# Patient Record
Sex: Female | Born: 1980
Health system: Southern US, Community
[De-identification: ages and names within clinical notes are randomized; demographics above are authoritative.]

## PROBLEM LIST (undated history)

## (undated) DIAGNOSIS — M549 Dorsalgia, unspecified: Secondary | ICD-10-CM

## (undated) DIAGNOSIS — F419 Anxiety disorder, unspecified: Secondary | ICD-10-CM

## (undated) DIAGNOSIS — I10 Essential (primary) hypertension: Secondary | ICD-10-CM

## (undated) DIAGNOSIS — R519 Headache, unspecified: Secondary | ICD-10-CM

## (undated) DIAGNOSIS — E7800 Pure hypercholesterolemia, unspecified: Secondary | ICD-10-CM

## (undated) DIAGNOSIS — R51 Headache: Secondary | ICD-10-CM

## (undated) DIAGNOSIS — K5900 Constipation, unspecified: Secondary | ICD-10-CM

## (undated) DIAGNOSIS — K219 Gastro-esophageal reflux disease without esophagitis: Secondary | ICD-10-CM

## (undated) DIAGNOSIS — Z803 Family history of malignant neoplasm of breast: Secondary | ICD-10-CM

## (undated) DIAGNOSIS — R7303 Prediabetes: Secondary | ICD-10-CM

## (undated) HISTORY — DX: Prediabetes: R73.03

## (undated) HISTORY — PX: BREAST SURGERY: SHX581

## (undated) HISTORY — DX: Constipation, unspecified: K59.00

## (undated) HISTORY — DX: Dorsalgia, unspecified: M54.9

## (undated) HISTORY — DX: Essential (primary) hypertension: I10

## (undated) HISTORY — DX: Anxiety disorder, unspecified: F41.9

## (undated) HISTORY — PX: TONSILLECTOMY: SUR1361

## (undated) HISTORY — DX: Family history of malignant neoplasm of breast: Z80.3

---

## 2013-02-19 ENCOUNTER — Encounter: Payer: Self-pay | Admitting: Family Medicine

## 2013-02-19 ENCOUNTER — Ambulatory Visit (INDEPENDENT_AMBULATORY_CARE_PROVIDER_SITE_OTHER): Payer: 59 | Admitting: Family Medicine

## 2013-02-19 VITALS — BP 108/80 | HR 76 | Temp 98.2°F | Wt 174.0 lb

## 2013-02-19 DIAGNOSIS — F411 Generalized anxiety disorder: Secondary | ICD-10-CM

## 2013-02-19 DIAGNOSIS — Z8249 Family history of ischemic heart disease and other diseases of the circulatory system: Secondary | ICD-10-CM | POA: Insufficient documentation

## 2013-02-19 DIAGNOSIS — F329 Major depressive disorder, single episode, unspecified: Secondary | ICD-10-CM | POA: Insufficient documentation

## 2013-02-19 DIAGNOSIS — R002 Palpitations: Secondary | ICD-10-CM | POA: Insufficient documentation

## 2013-02-19 DIAGNOSIS — Z136 Encounter for screening for cardiovascular disorders: Secondary | ICD-10-CM

## 2013-02-19 DIAGNOSIS — M549 Dorsalgia, unspecified: Secondary | ICD-10-CM | POA: Insufficient documentation

## 2013-02-19 DIAGNOSIS — F32A Depression, unspecified: Secondary | ICD-10-CM | POA: Insufficient documentation

## 2013-02-19 DIAGNOSIS — F419 Anxiety disorder, unspecified: Secondary | ICD-10-CM | POA: Insufficient documentation

## 2013-02-19 LAB — LIPID PANEL
LDL Cholesterol: 122 mg/dL — ABNORMAL HIGH (ref 0–99)
VLDL: 18.2 mg/dL (ref 0.0–40.0)

## 2013-02-19 MED ORDER — FLUOXETINE HCL 20 MG PO CAPS: 20.0000 mg | ORAL_CAPSULE | Freq: Every day | ORAL | Status: DC

## 2013-02-19 NOTE — Progress Notes (Signed)
  Subjective:    Patient ID: Felicia Mcdowell, female    DOB: [birth year], 32 y.o.   MRN: [id number]  HPI 32 yo G3P2 here to establish care.  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  Anxiety- has been well controlled on Prozac 20 mg daily.  Unfortunately had a miscarriage last January and had to increase dose to 20 mg daily understandably.  Has been fine since.  Denies any symptoms of anxiety or depression.  Back pain- reports it as muscular from neck to lower back.  Tries to get massage very other month.  No radiculopathy.  Palpitations- does drink up to 6 cups of coffee per day.  In afternoon, sometimes heart races into 140s.  No CP, SOB or dizziness when this occurs.  Does not feel anxious when it is occuring.  Patient Active Problem List  Diagnosis  . Anxiety  . Back pain  . Family history of early CAD  . Palpitations   Past Medical History  Diagnosis Date  . Anxiety    No past surgical history on file. History  Substance Use Topics  . Smoking status: Not on file  . Smokeless tobacco: Not on file  . Alcohol Use: Not on file   Family History  Problem Relation Age of Onset  . Cancer Mother 49    breast CA, now with metastatic cancer  . Depression Mother   . Heart disease Father   . Hyperlipidemia Father    Allergies  Allergen Reactions  . Augmentin (Amoxicillin-Pot Clavulanate) Hives and Rash    Rash    No current outpatient prescriptions on file prior to visit.   No current facility-administered medications on file prior to visit.   The PMH, PSH, Social History, Family History, Medications, and allergies have been reviewed in [facility], and have been updated if relevant.    Review of Systems See HPI    Objective:   Physical Exam BP 108/80  Pulse 76  Temp(Src) 98.2 F (36.8 C)  Wt 174 lb (78.926 kg)  General:  Well-developed,well-nourished,in no acute distress; alert,appropriate and cooperative throughout examination Head:  normocephalic and atraumatic.    Lungs:  Normal respiratory effort, chest expands symmetrically. Lungs are clear to auscultation, no crackles or wheezes. Heart:  Normal rate and regular rhythm. S1 split, murmur, click, rub or other extra sounds. Abdomen:  Bowel sounds positive,abdomen soft and non-tender without masses, organomegaly or hernias noted. Msk:  No deformity or scoliosis noted of thoracic or lumbar spine.   Extremities:  No clubbing, cyanosis, edema, or deformity noted with normal full range of motion of all joints.   Neurologic:  alert & oriented X3 and gait normal.   Skin:  Intact without suspicious lesions or rashes Psych:  Cognition and judgment appear intact. Alert and cooperative with normal attention span and concentration. No apparent delusions, illusions, hallucinations     Assessment & Plan:  1. Family history of early CAD Will check lipid panel today. - Lipid Panel  2. Screening for ischemic heart disease See above. - Lipid Panel  3. Back pain Discussed breast reduction- she does wear a DD.  She will talk to husband and get back to me.  4. Anxiety Controlled on prozac and very occasional ativan.  Rx refilled.  5. Palpitations Discussed work up with pt, including EKG and Holter monitor.  Pulse is great today.  She would like to cut back on caffeine first and get back to me which is reasonable.

## 2013-02-19 NOTE — Patient Instructions (Addendum)
It was nice to meet you. Think about a breast reduction and let me know.  Keep in touch!  [phone]- call me on my cell phone anytime.

## 2013-08-13 ENCOUNTER — Other Ambulatory Visit: Payer: Self-pay | Admitting: Obstetrics and Gynecology

## 2013-08-13 DIAGNOSIS — Z1231 Encounter for screening mammogram for malignant neoplasm of breast: Secondary | ICD-10-CM

## 2013-09-11 ENCOUNTER — Ambulatory Visit
Admission: RE | Admit: 2013-09-11 | Discharge: 2013-09-11 | Disposition: A | Discharge status: Home / Self Care | Payer: 59 | Source: Ambulatory Visit | Attending: Obstetrics and Gynecology | Admitting: Obstetrics and Gynecology

## 2013-09-11 DIAGNOSIS — Z1231 Encounter for screening mammogram for malignant neoplasm of breast: Secondary | ICD-10-CM

## 2013-11-16 ENCOUNTER — Other Ambulatory Visit: Payer: Self-pay | Admitting: Family Medicine

## 2013-11-16 MED ORDER — LEVONORGEST-ETH ESTRAD 91-DAY 0.15-0.03 &0.01 MG PO TABS: 1.0000 | ORAL_TABLET | Freq: Every day | ORAL | Status: DC

## 2014-02-22 ENCOUNTER — Ambulatory Visit (INDEPENDENT_AMBULATORY_CARE_PROVIDER_SITE_OTHER): Payer: 59 | Admitting: Family Medicine

## 2014-02-22 ENCOUNTER — Encounter: Payer: Self-pay | Admitting: Family Medicine

## 2014-02-22 VITALS — BP 106/78 | HR 72 | Temp 98.1°F | Ht 64.0 in | Wt 166.5 lb

## 2014-02-22 DIAGNOSIS — S86819A Strain of other muscle(s) and tendon(s) at lower leg level, unspecified leg, initial encounter: Secondary | ICD-10-CM

## 2014-02-22 DIAGNOSIS — M6580 Other synovitis and tenosynovitis, unspecified site: Secondary | ICD-10-CM

## 2014-02-22 DIAGNOSIS — M76899 Other specified enthesopathies of unspecified lower limb, excluding foot: Secondary | ICD-10-CM

## 2014-02-22 DIAGNOSIS — S838X9A Sprain of other specified parts of unspecified knee, initial encounter: Secondary | ICD-10-CM

## 2014-02-22 DIAGNOSIS — S86111A Strain of other muscle(s) and tendon(s) of posterior muscle group at lower leg level, right leg, initial encounter: Secondary | ICD-10-CM

## 2014-02-22 NOTE — Progress Notes (Signed)
Pre visit review using our clinic review tool, if applicable. No additional management support is needed unless otherwise documented below in the visit note. 

## 2014-02-22 NOTE — Progress Notes (Signed)
Date:  02/22/2014   Name:  Felicia Mcdowell   DOB:  04/24/1981   MRN:  [id number]  Primary Physician:  Ruthe Mannanalia Aron, MD   Chief Complaint: Sore spot on Left Leg   Subjective:   History of Present Illness:  Felicia Mcdowell is a 33 y.o. very pleasant female patient who presents with the following:  R sided knee pain, more lateral. Was walking on treadmill at 10% grade, 3 mph. Has a treadmill at home. The patient was exercising at home as above, and she has been trying to get in shape. Several weeks ago she experienced and significant pain on the posterior aspect of the lateral RIGHT lower extremity. Since then she has had some difficulty when she is trying to flex her knee, squat, doing any kind of twisting. This is limited her, and she has not been able to do any form of significant exercise since that time.  She has not had any prior significant knee injuries, operative interventions or fractures. She is grossly otherwise healthy.  2 daughters, 4 and 6.  Hurt when try to bend, squat, twisting.   Past Medical History, Surgical History, Social History, Family History, Problem List, Medications, and Allergies have been reviewed and updated if relevant.  Review of Systems:  GEN: No fevers, chills. Nontoxic. Primarily MSK c/o today. MSK: Detailed in the HPI GI: tolerating PO intake without difficulty Neuro: No numbness, parasthesias, or tingling associated. Otherwise the pertinent positives of the ROS are noted above.   Objective:   Physical Examination: BP 106/78  Pulse 72  Temp(Src) 98.1 F (36.7 C) (Oral)  Ht 5\' 4"  (1.626 m)  Wt 166 lb 8 oz (75.524 kg)  BMI 28.57 kg/m2   GEN: WDWN, NAD, Non-toxic, Alert & Oriented x 3 HEENT: Atraumatic, Normocephalic.  Ears and Nose: No external deformity. EXTR: No clubbing/cyanosis/edema NEURO: Normal gait.  PSYCH: Normally interactive. Conversant. Not depressed or anxious appearing.  Calm demeanor.   Knee:  R Gait: Normal heel toe  pattern ROM: hyperextension 2 deg, flexion to 135, initially stiff. Effusion: neg Echymosis or edema: none Patellar tendon NT Painful PLICA: neg Patellar grind: negative Medial and lateral patellar facet loading: negative medial and lateral joint lines: lateral, more inferior to the joint line Mcmurray's pain Flexion-pinch pain Bounce home negative Apley's test positive Varus and valgus stress: stable Lachman: neg Ant and Post drawer: neg Hip abduction, IR, ER: WNL Hip flexion str: 5/5 Hip abd: 5/5 Quad: 5/5 VMO atrophy:No Hamstring concentric and eccentric: 4-/5, marked lateral pain with testing of biceps femoris at 90 deg, not tested at 30.   TTP mildly on lateral gastroc tendon.   Radiology: No results found.  Assessment & Plan:   Biceps femoris tendinitis  Strain of gastrocnemius tendon of right lower extremity  >25 minutes spent in face to face time with patient, >50% spent in counselling or coordination of care: DOI approx 01/31/2014 Suspect more likely biceps femoris insertional tendinopathy vs small tear at insertion post 10 degree impact walking with gastroc involved laterally to a lesser extent. Meniscal tests are +/-, but most pain not directly at meniscus and at origin of the biceps femoris. Reviewed anatomy, rehab. Cannot fully exclude lateral meniscal tear, and if symptoms persist, we may need to further evaluate.   Patient's Medications  New Prescriptions   No medications on file  Previous Medications   FLUOXETINE (PROZAC) 20 MG CAPSULE    Take 1 capsule (20 mg total) by mouth daily.   LEVONORGESTREL-ETHINYL ESTRADIOL (  SEASONIQUE) 0.15-0.03 &0.01 MG TABLET    Take 1 tablet by mouth daily.   LORAZEPAM (ATIVAN) 0.5 MG TABLET    Take one by mouth as needed.  Modified Medications   No medications on file  Discontinued Medications   LEVONORGESTREL-ETHINYL ESTRADIOL (CAMRESE) 0.15-0.03 &0.01 MG TABLET    Take 1 tablet by mouth daily.   Patient Instructions   Treadmill: Calf and posterior lower extremity rehab  Forward Walking: Go light 2 mins, easy about 2-3 mph: At Sideways Left: 2 mins, 0.6 - 0.8 mph Sideways Right: 2 mins, 0.6 - 0.8 mph Backwards, 2 mins, 1.8 - 2.2 mph Repeat, several cycles Goal is 30 minute  Start at a 3 degree incline - can slightly lower if necessary When able, increase to 3.5, then 4, then 4.5 (After you comfortably can do 30 minutes)    Signed,  Ramar Nobrega T. Dorcas Melito, MD, CAQ Sports Medicine  [employer] at [facility] [address] [place] [address] Phone: [phone] Fax: [phone]

## 2014-02-22 NOTE — Patient Instructions (Signed)
Treadmill: Calf and posterior lower extremity rehab  Forward Walking: Go light 2 mins, easy about 2-3 mph: At Sideways Left: 2 mins, 0.6 - 0.8 mph Sideways Right: 2 mins, 0.6 - 0.8 mph Backwards, 2 mins, 1.8 - 2.2 mph Repeat, several cycles Goal is 30 minute  Start at a 3 degree incline - can slightly lower if necessary When able, increase to 3.5, then 4, then 4.5 (After you comfortably can do 30 minutes)  

## 2014-02-23 ENCOUNTER — Encounter: Payer: Self-pay | Admitting: Family Medicine

## 2014-02-24 ENCOUNTER — Encounter: Payer: Self-pay | Admitting: Family Medicine

## 2014-02-24 ENCOUNTER — Ambulatory Visit (INDEPENDENT_AMBULATORY_CARE_PROVIDER_SITE_OTHER): Payer: 59 | Admitting: Family Medicine

## 2014-02-24 VITALS — BP 126/64 | HR 82 | Temp 98.2°F | Wt 166.4 lb

## 2014-02-24 DIAGNOSIS — Z0000 Encounter for general adult medical examination without abnormal findings: Secondary | ICD-10-CM | POA: Insufficient documentation

## 2014-02-24 DIAGNOSIS — F419 Anxiety disorder, unspecified: Secondary | ICD-10-CM

## 2014-02-24 DIAGNOSIS — Z136 Encounter for screening for cardiovascular disorders: Secondary | ICD-10-CM

## 2014-02-24 DIAGNOSIS — F411 Generalized anxiety disorder: Secondary | ICD-10-CM

## 2014-02-24 LAB — CBC WITH DIFFERENTIAL/PLATELET
BASOS ABS: 0 10*3/uL (ref 0.0–0.1)
Basophils Relative: 0.4 % (ref 0.0–3.0)
Eosinophils Absolute: 0.1 10*3/uL (ref 0.0–0.7)
Eosinophils Relative: 1.2 % (ref 0.0–5.0)
HCT: 40.8 % (ref 36.0–46.0)
HEMOGLOBIN: 13.6 g/dL (ref 12.0–15.0)
LYMPHS ABS: 2.9 10*3/uL (ref 0.7–4.0)
Lymphocytes Relative: 39.5 % (ref 12.0–46.0)
MCHC: 33.3 g/dL (ref 30.0–36.0)
MCV: 86.6 fl (ref 78.0–100.0)
MONO ABS: 0.6 10*3/uL (ref 0.1–1.0)
Monocytes Relative: 7.6 % (ref 3.0–12.0)
Neutro Abs: 3.7 10*3/uL (ref 1.4–7.7)
Neutrophils Relative %: 51.3 % (ref 43.0–77.0)
PLATELETS: 322 10*3/uL (ref 150.0–400.0)
RBC: 4.71 Mil/uL (ref 3.87–5.11)
RDW: 12.9 % (ref 11.5–14.6)
WBC: 7.3 10*3/uL (ref 4.5–10.5)

## 2014-02-24 LAB — COMPREHENSIVE METABOLIC PANEL
ALT: 23 U/L (ref 0–35)
AST: 18 U/L (ref 0–37)
Albumin: 3.9 g/dL (ref 3.5–5.2)
Alkaline Phosphatase: 67 U/L (ref 39–117)
BILIRUBIN TOTAL: 0.6 mg/dL (ref 0.3–1.2)
BUN: 11 mg/dL (ref 6–23)
CO2: 26 mEq/L (ref 19–32)
Calcium: 9.2 mg/dL (ref 8.4–10.5)
Chloride: 103 mEq/L (ref 96–112)
Creatinine, Ser: 0.8 mg/dL (ref 0.4–1.2)
GFR: 93.19 mL/min (ref >60.00)
Glucose, Bld: 103 mg/dL — ABNORMAL HIGH (ref 70–99)
Potassium: 4.1 mEq/L (ref 3.5–5.1)
SODIUM: 138 meq/L (ref 135–145)
TOTAL PROTEIN: 7.7 g/dL (ref 6.0–8.3)

## 2014-02-24 LAB — LIPID PANEL
CHOLESTEROL: 230 mg/dL — AB (ref 0–200)
HDL: 32.1 mg/dL — ABNORMAL LOW (ref >39.00)
LDL Cholesterol: 172 mg/dL — ABNORMAL HIGH (ref 0–99)
Total CHOL/HDL Ratio: 7
Triglycerides: 130 mg/dL (ref 0.0–149.0)
VLDL: 26 mg/dL (ref 0.0–40.0)

## 2014-02-24 LAB — TSH: TSH: 0.33 u[IU]/mL — AB (ref 0.35–5.50)

## 2014-02-24 MED ORDER — FLUOXETINE HCL 20 MG PO CAPS: 20.0000 mg | ORAL_CAPSULE | Freq: Every day | ORAL | Status: DC

## 2014-02-24 MED ORDER — LORAZEPAM 0.5 MG PO TABS: 0.5000 mg | ORAL_TABLET | Freq: Every day | ORAL | Status: DC | PRN

## 2014-02-24 MED ORDER — LEVONORGEST-ETH ESTRAD 91-DAY 0.15-0.03 &0.01 MG PO TABS: 1.0000 | ORAL_TABLET | Freq: Every day | ORAL | Status: DC

## 2014-02-24 MED ORDER — FLUOXETINE HCL 10 MG PO CAPS: 10.0000 mg | ORAL_CAPSULE | Freq: Every day | ORAL | Status: DC

## 2014-02-24 NOTE — Progress Notes (Addendum)
Subjective:    Patient ID: Felicia Mcdowell, female    DOB: Aug 20, 1981, 33 y.o.   MRN: [id number]  HPI 33 yo pleasant female here for CPX.  Anxiety- has been well controlled on Prozac 20 mg daily.  She would like to try lower dose.  Now that things have settled with her mom's death, she feels much better.  Palpitations have resolved since she stopped drinking diet soft drinks.  UTD prevention- pap smear 03/07/2013. First screening mammogram 08/2013- mom had first breast CA at 16 yo.  Has been working on diet and exercising.  Max weight was 182 and got down to 161.  Did gain a few pounds during Easter and when she injured her leg.  Feels better.   Patient Active Problem List   Diagnosis Date Noted  . Back pain 02/19/2013  . Family history of early CAD 02/19/2013  . Palpitations 02/19/2013  . Anxiety    Past Medical History  Diagnosis Date  . Anxiety    No past surgical history on file. History  Substance Use Topics  . Smoking status: Never Smoker   . Smokeless tobacco: Never Used  . Alcohol Use: Yes   Family History  Problem Relation Age of Onset  . Cancer Mother 66    breast CA, now with metastatic cancer  . Depression Mother   . Heart disease Father   . Hyperlipidemia Father    Allergies  Allergen Reactions  . Augmentin [Amoxicillin-Pot Clavulanate] Hives and Rash    Rash    Current Outpatient Prescriptions on File Prior to Visit  Medication Sig Dispense Refill  . FLUoxetine (PROZAC) 20 MG capsule Take 1 capsule (20 mg total) by mouth daily.  90 capsule  3  . Levonorgestrel-Ethinyl Estradiol (SEASONIQUE) 0.15-0.03 &0.01 MG tablet Take 1 tablet by mouth daily.      Marland Kitchen LORazepam (ATIVAN) 0.5 MG tablet Take one by mouth as needed.       No current facility-administered medications on file prior to visit.   The PMH, PSH, Social History, Family History, Medications, and allergies have been reviewed in [facility], and have been updated if relevant.    Review of Systems See  HPI    Patient reports no  vision/ hearing changes,anorexia, weight change, fever ,adenopathy, persistant / recurrent hoarseness, swallowing issues, chest pain, edema,persistant / recurrent cough, hemoptysis, dyspnea(rest, exertional, paroxysmal nocturnal), gastrointestinal  bleeding (melena, rectal bleeding), abdominal pain, excessive heart burn, GU symptoms(dysuria, hematuria, pyuria, voiding/incontinence  Issues) syncope, focal weakness, severe memory loss, concerning skin lesions, depression, anxiety, abnormal bruising/bleeding, major joint swelling, breast masses or abnormal vaginal bleeding.    Objective:   Physical Exam BP 126/64  Pulse 82  Temp(Src) 98.2 F (36.8 C) (Oral)  Wt 166 lb 6 oz (75.467 kg)  SpO2 97%  LMP 02/22/2014  Wt Readings from Last 3 Encounters:  02/24/14 166 lb 6 oz (75.467 kg)  02/22/14 166 lb 8 oz (75.524 kg)  02/19/13 174 lb (78.926 kg)    General:  Well-developed,well-nourished,in no acute distress; alert,appropriate and cooperative throughout examination Head:  normocephalic and atraumatic.   Lungs:  Normal respiratory effort, chest expands symmetrically. Lungs are clear to auscultation, no crackles or wheezes. Heart:  Normal rate and regular rhythm. S1 split, murmur, click, rub or other extra sounds. Abdomen:  Bowel sounds positive,abdomen soft and non-tender without masses, organomegaly or hernias noted. Msk:  No deformity or scoliosis noted of thoracic or lumbar spine.   Extremities:  No clubbing, cyanosis, edema, or deformity noted  with normal full range of motion of all joints.   Neurologic:  alert & oriented X3 and gait normal.   Skin:  Intact without suspicious lesions or rashes Psych:  Cognition and judgment appear intact. Alert and cooperative with normal attention span and concentration. No apparent delusions, illusions, hallucinations     Assessment & Plan:   .

## 2014-02-24 NOTE — Assessment & Plan Note (Signed)
Reviewed preventive care protocols, scheduled due services, and updated immunizations Discussed nutrition, exercise, diet, and healthy lifestyle.  Orders Placed This Encounter  Procedures  . CBC with Differential  . Comprehensive metabolic panel  . Lipid panel  . TSH    

## 2014-02-24 NOTE — Assessment & Plan Note (Signed)
Improved.  Decrease prozac to 10 mg daily.

## 2014-02-25 ENCOUNTER — Other Ambulatory Visit: Payer: Self-pay | Admitting: Family Medicine

## 2014-02-25 DIAGNOSIS — E785 Hyperlipidemia, unspecified: Secondary | ICD-10-CM

## 2014-02-25 DIAGNOSIS — R7989 Other specified abnormal findings of blood chemistry: Secondary | ICD-10-CM

## 2014-02-25 MED ORDER — ROSUVASTATIN CALCIUM 10 MG PO TABS: 10.0000 mg | ORAL_TABLET | Freq: Every day | ORAL | Status: DC

## 2014-03-22 ENCOUNTER — Other Ambulatory Visit: Payer: Self-pay | Admitting: Family Medicine

## 2014-03-22 MED ORDER — FLUOXETINE HCL 20 MG PO CAPS: 20.0000 mg | ORAL_CAPSULE | Freq: Every day | ORAL | Status: DC

## 2014-03-29 ENCOUNTER — Other Ambulatory Visit: Payer: Self-pay | Admitting: Family Medicine

## 2014-03-29 ENCOUNTER — Other Ambulatory Visit (INDEPENDENT_AMBULATORY_CARE_PROVIDER_SITE_OTHER): Payer: 59

## 2014-03-29 DIAGNOSIS — E785 Hyperlipidemia, unspecified: Secondary | ICD-10-CM | POA: Insufficient documentation

## 2014-03-29 LAB — LIPID PANEL
CHOLESTEROL: 134 mg/dL (ref 0–200)
HDL: 31.9 mg/dL — ABNORMAL LOW (ref >39.00)
LDL Cholesterol: 84 mg/dL (ref 0–99)
Total CHOL/HDL Ratio: 4
Triglycerides: 90 mg/dL (ref 0.0–149.0)
VLDL: 18 mg/dL (ref 0.0–40.0)

## 2014-03-29 LAB — COMPREHENSIVE METABOLIC PANEL
ALBUMIN: 4 g/dL (ref 3.5–5.2)
ALK PHOS: 60 U/L (ref 39–117)
ALT: 17 U/L (ref 0–35)
AST: 15 U/L (ref 0–37)
BILIRUBIN TOTAL: 0.8 mg/dL (ref 0.2–1.2)
BUN: 10 mg/dL (ref 6–23)
CO2: 26 mEq/L (ref 19–32)
Calcium: 9.3 mg/dL (ref 8.4–10.5)
Chloride: 104 mEq/L (ref 96–112)
Creatinine, Ser: 0.7 mg/dL (ref 0.4–1.2)
GFR: 111.26 mL/min (ref >60.00)
GLUCOSE: 75 mg/dL (ref 70–99)
Potassium: 4.2 mEq/L (ref 3.5–5.1)
Sodium: 137 mEq/L (ref 135–145)
Total Protein: 7.4 g/dL (ref 6.0–8.3)

## 2014-04-16 ENCOUNTER — Other Ambulatory Visit: Payer: Self-pay | Admitting: Family Medicine

## 2014-04-16 MED ORDER — FLUCONAZOLE 150 MG PO TABS: 150.0000 mg | ORAL_TABLET | Freq: Once | ORAL | Status: AC

## 2014-05-14 ENCOUNTER — Ambulatory Visit: Payer: 59 | Admitting: Family Medicine

## 2014-05-31 ENCOUNTER — Telehealth: Payer: Self-pay | Admitting: Family Medicine

## 2014-05-31 MED ORDER — SIMVASTATIN 20 MG PO TABS: 20.0000 mg | ORAL_TABLET | Freq: Every day | ORAL | Status: DC

## 2014-05-31 NOTE — Telephone Encounter (Signed)
Pt requests to be started on another statin.  Crestor not covered by insurance.  Per pt, lipitor did not work in past.  Rx sent for simvastatin to [facility].  Please schedule follow up labs in 4 weeks- lipid panel, CMET (272.4).

## 2014-05-31 NOTE — Telephone Encounter (Signed)
Patient notified as instructed. Rene KocherRegina stated that she will have the repeat lab work done as instructed.

## 2014-08-10 ENCOUNTER — Other Ambulatory Visit: Payer: Self-pay | Admitting: *Deleted

## 2014-08-10 MED ORDER — FLUOXETINE HCL 20 MG PO CAPS: 20.0000 mg | ORAL_CAPSULE | Freq: Every day | ORAL | Status: DC

## 2014-08-10 MED ORDER — SIMVASTATIN 20 MG PO TABS: 20.0000 mg | ORAL_TABLET | Freq: Every day | ORAL | Status: DC

## 2014-12-20 ENCOUNTER — Other Ambulatory Visit: Payer: Self-pay

## 2014-12-20 MED ORDER — LEVONORGEST-ETH ESTRAD 91-DAY 0.15-0.03 &0.01 MG PO TABS: 1.0000 | ORAL_TABLET | Freq: Every day | ORAL | Status: DC

## 2015-03-02 ENCOUNTER — Encounter: Payer: Self-pay | Admitting: Family Medicine

## 2015-03-02 ENCOUNTER — Ambulatory Visit (INDEPENDENT_AMBULATORY_CARE_PROVIDER_SITE_OTHER): Payer: 59 | Admitting: Family Medicine

## 2015-03-02 VITALS — BP 122/76 | HR 100 | Temp 98.3°F | Wt 178.0 lb

## 2015-03-02 DIAGNOSIS — E785 Hyperlipidemia, unspecified: Secondary | ICD-10-CM

## 2015-03-02 DIAGNOSIS — F419 Anxiety disorder, unspecified: Secondary | ICD-10-CM | POA: Diagnosis not present

## 2015-03-02 MED ORDER — FLUOXETINE HCL 20 MG PO CAPS: 20.0000 mg | ORAL_CAPSULE | Freq: Every day | ORAL | Status: DC

## 2015-03-02 MED ORDER — LEVONORGEST-ETH ESTRAD 91-DAY 0.15-0.03 &0.01 MG PO TABS: 1.0000 | ORAL_TABLET | Freq: Every day | ORAL | Status: DC

## 2015-03-02 MED ORDER — SIMVASTATIN 20 MG PO TABS: 20.0000 mg | ORAL_TABLET | Freq: Every day | ORAL | Status: DC

## 2015-03-02 NOTE — Assessment & Plan Note (Signed)
Stable on current rx. 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 Work on cutting back on saturated fats, fast food. The patient indicates understanding of these issues and agrees with the plan.

## 2015-03-02 NOTE — Progress Notes (Signed)
Pre visit review using our clinic review tool, if applicable. No additional management support is needed unless otherwise documented below in the visit note. 

## 2015-03-02 NOTE — Assessment & Plan Note (Signed)
Stable on current rx. No changes made. 

## 2015-03-02 NOTE — Progress Notes (Signed)
Subjective:   Patient ID: Felicia Mcdowell, female    DOB: 02/25/1981, 34 y.o.   MRN: [id number]  Felicia Mcdowell is a pleasant 34 y.o. year old female who presents to clinic today with Follow-up  on 03/02/2015  HPI: HLD- has been well controlled on current dose of zocor.  No myalgias. Due for labs next month. Diet has not been great, has also gained weight.  Increased stress at home now that mother in law is living with them. Lab Results  Component Value Date   CHOL 134 03/29/2014   HDL 31.90* 03/29/2014   LDLCALC 84 03/29/2014   TRIG 90.0 03/29/2014   CHOLHDL 4 03/29/2014   Wt Readings from Last 3 Encounters:  03/02/15 178 lb (80.74 kg)  02/24/14 166 lb 6 oz (75.467 kg)  02/22/14 166 lb 8 oz (75.524 kg)   Anxiety- she feels current dose of prozac is working well.  Has not needed to take Ativan in months. Denies SI or HI.  Has not been tearful.  Sleeping well.  Current Outpatient Prescriptions on File Prior to Visit  Medication Sig Dispense Refill  . LORazepam (ATIVAN) 0.5 MG tablet Take 1 tablet (0.5 mg total) by mouth daily as needed for anxiety. Take one by mouth as needed. 30 tablet 0   No current facility-administered medications on file prior to visit.    Allergies  Allergen Reactions  . Augmentin [Amoxicillin-Pot Clavulanate] Hives and Rash    Rash     Past Medical History  Diagnosis Date  . Anxiety     No past surgical history on file.  Family History  Problem Relation Age of Onset  . Cancer Mother 1740    breast CA, now with metastatic cancer  . Depression Mother   . Heart disease Father   . Hyperlipidemia Father     History   Social History  . Marital Status: Married    Spouse Name: N/A  . Number of Children: N/A  . Years of Education: N/A   Occupational History  . Not on file.   Social History Main Topics  . Smoking status: Never Smoker   . Smokeless tobacco: Never Used  . Alcohol Use: Yes  . Drug Use: No  . Sexual Activity: Not on file    Other Topics Concern  . 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            The PMH, PSH, Social History, Family History, Medications, and allergies [facility], and have been updated if relevant.   Review of Systems  Constitutional: Negative.   HENT: Negative.   Musculoskeletal: Negative.   Neurological: Negative.   Psychiatric/Behavioral: Negative.   All other systems reviewed and are negative.      Objective:    BP 122/76 mmHg  Pulse 100  Temp(Src) 98.3 F (36.8 C) (Oral)  Wt 178 lb (80.74 kg)  SpO2 97%   Physical Exam  Constitutional: She is oriented to person, place, and time. She appears well-developed and well-nourished. No distress.  HENT:  Head: Normocephalic.  Eyes: Conjunctivae are normal.  Neck: Normal range of motion.  Cardiovascular: Normal rate.   Pulmonary/Chest: Effort normal.  Neurological: She is alert and oriented to person, place, and time. No cranial nerve deficit.  Skin: Skin is warm and dry.  Psychiatric: She has a normal mood and affect. Her behavior is normal. Judgment and  thought content normal.  Nursing note and vitals reviewed.         Assessment & Plan:   Anxiety  HLD (hyperlipidemia) - Plan: Lipid panel, Comprehensive metabolic panel No Follow-up on file.

## 2015-04-11 ENCOUNTER — Encounter: Payer: Self-pay | Admitting: Family Medicine

## 2015-04-11 ENCOUNTER — Ambulatory Visit (INDEPENDENT_AMBULATORY_CARE_PROVIDER_SITE_OTHER): Payer: 59 | Admitting: Family Medicine

## 2015-04-11 VITALS — BP 130/82 | HR 93 | Temp 97.9°F | Wt 171.1 lb

## 2015-04-11 DIAGNOSIS — R1011 Right upper quadrant pain: Secondary | ICD-10-CM | POA: Diagnosis not present

## 2015-04-11 LAB — CBC WITH DIFFERENTIAL/PLATELET
Basophils Absolute: 0 10*3/uL (ref 0.0–0.1)
Basophils Relative: 0.4 % (ref 0.0–3.0)
EOS ABS: 0.1 10*3/uL (ref 0.0–0.7)
Eosinophils Relative: 1 % (ref 0.0–5.0)
HCT: 41.4 % (ref 36.0–46.0)
Hemoglobin: 14 g/dL (ref 12.0–15.0)
LYMPHS ABS: 3 10*3/uL (ref 0.7–4.0)
LYMPHS PCT: 35.6 % (ref 12.0–46.0)
MCHC: 33.8 g/dL (ref 30.0–36.0)
MCV: 86 fl (ref 78.0–100.0)
Monocytes Absolute: 0.6 10*3/uL (ref 0.1–1.0)
Monocytes Relative: 6.8 % (ref 3.0–12.0)
NEUTROS ABS: 4.7 10*3/uL (ref 1.4–7.7)
NEUTROS PCT: 56.2 % (ref 43.0–77.0)
Platelets: 258 10*3/uL (ref 150.0–400.0)
RBC: 4.82 Mil/uL (ref 3.87–5.11)
RDW: 12.4 % (ref 11.5–15.5)
WBC: 8.4 10*3/uL (ref 4.0–10.5)

## 2015-04-11 LAB — COMPREHENSIVE METABOLIC PANEL
ALK PHOS: 70 U/L (ref 39–117)
ALT: 13 U/L (ref 0–35)
AST: 15 U/L (ref 0–37)
Albumin: 4.4 g/dL (ref 3.5–5.2)
BILIRUBIN TOTAL: 0.4 mg/dL (ref 0.2–1.2)
BUN: 10 mg/dL (ref 6–23)
CHLORIDE: 102 meq/L (ref 96–112)
CO2: 30 mEq/L (ref 19–32)
CREATININE: 0.76 mg/dL (ref 0.40–1.20)
Calcium: 9.4 mg/dL (ref 8.4–10.5)
GFR: 93.91 mL/min (ref >60.00)
Glucose, Bld: 93 mg/dL (ref 70–99)
Potassium: 3.6 mEq/L (ref 3.5–5.1)
Sodium: 137 mEq/L (ref 135–145)
TOTAL PROTEIN: 7.7 g/dL (ref 6.0–8.3)

## 2015-04-11 LAB — H. PYLORI ANTIBODY, IGG: H Pylori IgG: NEGATIVE

## 2015-04-11 LAB — LIPASE: Lipase: 36 U/L (ref 11.0–59.0)

## 2015-04-11 NOTE — Progress Notes (Signed)
Subjective:   Patient ID: Felicia Mcdowell Advincula, female    DOB: 02/02/1981, 34 y.o.   MRN: [id number]  Felicia Mcdowell Finlay is a pleasant 34 y.o. year old female who presents to clinic today with Abdominal Pain  on 04/11/2015  HPI:  Acute RUQ pain- started around 4:30 pm yesterday.  10/10 RUQ "feels like contractions." Lasted 30 seconds or less at a time but continued intermittently for 12 hours or more. Radiated to her right back.  Nausea without vomiting. Also associated with diarrhea. Has had similar attacks in past but never this severe.  Pepcid and zofran were ineffective.  Today, feels "sore" but pain has otherwise resolved.  Current Outpatient Prescriptions on File Prior to Visit  Medication Sig Dispense Refill  . FLUoxetine (PROZAC) 20 MG capsule Take 1 capsule (20 mg total) by mouth daily. 90 capsule 3  . Levonorgestrel-Ethinyl Estradiol (AMETHIA,CAMRESE) 0.15-0.03 &0.01 MG tablet Take 1 tablet by mouth daily. 1 Package 11  . LORazepam (ATIVAN) 0.5 MG tablet Take 1 tablet (0.5 mg total) by mouth daily as needed for anxiety. Take one by mouth as needed. 30 tablet 0  . simvastatin (ZOCOR) 20 MG tablet Take 1 tablet (20 mg total) by mouth at bedtime. 90 tablet 3   No current facility-administered medications on file prior to visit.    Allergies  Allergen Reactions  . Augmentin [Amoxicillin-Pot Clavulanate] Hives and Rash    Rash     Past Medical History  Diagnosis Date  . Anxiety     No past surgical history on file.  Family History  Problem Relation Age of Onset  . Cancer Mother 6840    breast CA, now with metastatic cancer  . Depression Mother   . Heart disease Father   . Hyperlipidemia Father     History   Social History  . Marital Status: Married    Spouse Name: N/A  . Number of Children: N/A  . Years of Education: N/A   Occupational History  . Not on file.   Social History Main Topics  . Smoking status: Never Smoker   . Smokeless tobacco: Never Used  . Alcohol  Use: Yes  . Drug Use: No  . Sexual Activity: Not on file   Other Topics Concern  . 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            The PMH, PSH, Social History, Family History, Medications, and allergies [facility], and have been updated if relevant.   Review of Systems  Constitutional: Negative for fever, chills and diaphoresis.  Gastrointestinal: Positive for nausea, abdominal pain and diarrhea. Negative for vomiting and constipation.  Genitourinary: Negative.   Musculoskeletal: Negative.   Skin: Negative.   Neurological: Negative.   All other systems reviewed and are negative.      Objective:    BP 130/82 mmHg  Pulse 93  Temp(Src) 97.9 F (36.6 C) (Oral)  Wt 171 lb 2 oz (77.622 kg)  SpO2 96%   Physical Exam  Constitutional: She is oriented to person, place, and time. She appears well-developed and well-nourished. No distress.  HENT:  Head: Normocephalic and atraumatic.  Eyes: Conjunctivae are normal.  Cardiovascular: Normal rate.   Pulmonary/Chest: Effort normal.  Abdominal: Soft. Bowel sounds are normal. She exhibits no distension and no mass. There is no tenderness. There is no rebound and no guarding.  Musculoskeletal: She exhibits no  edema.  Neurological: She is alert and oriented to person, place, and time. No cranial nerve deficit.  Skin: Skin is warm and dry.  Psychiatric: She has a normal mood and affect. Her behavior is normal. Judgment and thought content normal.  Nursing note and vitals reviewed.         Assessment & Plan:   RUQ pain - Plan: H. pylori antibody, IgG, Lipase, CBC with Differential/Platelet, Comprehensive metabolic panel, US Abdomen Limited RUQ No Follow-up on file.

## 2015-04-11 NOTE — Progress Notes (Signed)
Pre visit review using our clinic review tool, if applicable. No additional management support is needed unless otherwise documented below in the visit note. 

## 2015-04-11 NOTE — Assessment & Plan Note (Signed)
New- intermittent. Currently asymptomatic. ? Biliary colic but cannot rule out PUD, pancreatitis, H pylori although less likely. RUQ US ordered to evaluate gallbladder/rule out gallstones. Check labs today. Advised PPI and or apple cider vinegar if symptoms re occur or go straight to ER if as severe as they were last night. The patient indicates understanding of these issues and agrees with the plan.   Orders Placed This Encounter  Procedures  . US Abdomen Limited RUQ  . H. pylori antibody, IgG  . Lipase  . CBC with Differential/Platelet  . Comprehensive metabolic panel

## 2015-04-11 NOTE — Patient Instructions (Signed)
[facility]. Apple cider vinegar

## 2015-04-12 ENCOUNTER — Ambulatory Visit
Admission: RE | Admit: 2015-04-12 | Discharge: 2015-04-12 | Disposition: A | Discharge status: Home / Self Care | Payer: 59 | Source: Ambulatory Visit | Attending: Family Medicine | Admitting: Family Medicine

## 2015-04-12 DIAGNOSIS — R1011 Right upper quadrant pain: Secondary | ICD-10-CM | POA: Insufficient documentation

## 2015-06-09 ENCOUNTER — Other Ambulatory Visit (INDEPENDENT_AMBULATORY_CARE_PROVIDER_SITE_OTHER): Payer: 59

## 2015-06-09 ENCOUNTER — Other Ambulatory Visit: Payer: Self-pay | Admitting: Family Medicine

## 2015-06-09 DIAGNOSIS — E785 Hyperlipidemia, unspecified: Secondary | ICD-10-CM | POA: Diagnosis not present

## 2015-06-09 DIAGNOSIS — R7309 Other abnormal glucose: Secondary | ICD-10-CM | POA: Diagnosis not present

## 2015-06-09 DIAGNOSIS — Z87898 Personal history of other specified conditions: Secondary | ICD-10-CM | POA: Insufficient documentation

## 2015-06-09 LAB — COMPREHENSIVE METABOLIC PANEL
ALT: 21 U/L (ref 0–35)
AST: 20 U/L (ref 0–37)
Albumin: 4.1 g/dL (ref 3.5–5.2)
Alkaline Phosphatase: 76 U/L (ref 39–117)
BILIRUBIN TOTAL: 0.3 mg/dL (ref 0.2–1.2)
BUN: 12 mg/dL (ref 6–23)
CALCIUM: 9.2 mg/dL (ref 8.4–10.5)
CO2: 29 mEq/L (ref 19–32)
Chloride: 104 mEq/L (ref 96–112)
Creatinine, Ser: 0.79 mg/dL (ref 0.40–1.20)
GFR: 89.72 mL/min (ref >60.00)
GLUCOSE: 122 mg/dL — AB (ref 70–99)
Potassium: 4.1 mEq/L (ref 3.5–5.1)
SODIUM: 138 meq/L (ref 135–145)
Total Protein: 6.8 g/dL (ref 6.0–8.3)

## 2015-06-09 LAB — LIPID PANEL
CHOL/HDL RATIO: 4
Cholesterol: 125 mg/dL (ref 0–200)
HDL: 35.2 mg/dL — AB (ref >39.00)
LDL Cholesterol: 73 mg/dL (ref 0–99)
NonHDL: 89.8
TRIGLYCERIDES: 86 mg/dL (ref 0.0–149.0)
VLDL: 17.2 mg/dL (ref 0.0–40.0)

## 2015-06-09 LAB — HEMOGLOBIN A1C: HEMOGLOBIN A1C: 5.4 % (ref 4.6–6.5)

## 2015-09-12 ENCOUNTER — Other Ambulatory Visit: Payer: Self-pay | Admitting: Family Medicine

## 2015-09-12 MED ORDER — SCOPOLAMINE 1 MG/3DAYS TD PT72: 1.0000 | MEDICATED_PATCH | TRANSDERMAL | Status: DC

## 2015-09-13 ENCOUNTER — Other Ambulatory Visit: Payer: Self-pay | Admitting: Family Medicine

## 2015-09-13 MED ORDER — SCOPOLAMINE 1 MG/3DAYS TD PT72: 1.0000 | MEDICATED_PATCH | TRANSDERMAL | Status: DC

## 2015-09-13 NOTE — Addendum Note (Signed)
Addended by: Desmond DikeKNIGHT, Lateka Rady H on: 09/13/2015 07:54 AM   Modules accepted: Orders

## 2015-09-13 NOTE — Telephone Encounter (Signed)
Nausea patch sent to mail order. Per pt, they will not arrive before her trip; resent to local pharmacy

## 2015-10-20 ENCOUNTER — Other Ambulatory Visit: Payer: Self-pay | Admitting: *Deleted

## 2015-10-20 MED ORDER — LEVONORGEST-ETH ESTRAD 91-DAY 0.15-0.03 &0.01 MG PO TABS: 1.0000 | ORAL_TABLET | Freq: Every day | ORAL | Status: DC

## 2015-10-20 MED ORDER — FLUOXETINE HCL 20 MG PO CAPS: 20.0000 mg | ORAL_CAPSULE | Freq: Every day | ORAL | Status: DC

## 2015-11-15 ENCOUNTER — Other Ambulatory Visit: Payer: Self-pay | Admitting: Family Medicine

## 2015-11-15 DIAGNOSIS — K8050 Calculus of bile duct without cholangitis or cholecystitis without obstruction: Secondary | ICD-10-CM

## 2015-11-15 DIAGNOSIS — R1011 Right upper quadrant pain: Secondary | ICD-10-CM

## 2015-11-18 ENCOUNTER — Ambulatory Visit
Admission: RE | Admit: 2015-11-18 | Discharge: 2015-11-18 | Disposition: A | Discharge status: Home / Self Care | Payer: 59 | Source: Ambulatory Visit | Attending: Family Medicine | Admitting: Family Medicine

## 2015-11-18 DIAGNOSIS — R1011 Right upper quadrant pain: Secondary | ICD-10-CM | POA: Diagnosis not present

## 2015-11-18 MED ORDER — TECHNETIUM TC 99M MEBROFENIN IV KIT
5.5100 | PACK | Freq: Once | INTRAVENOUS | Status: AC | PRN
  Administered 2015-11-18: 5.51 via INTRAVENOUS

## 2015-11-18 MED ORDER — SINCALIDE 5 MCG IJ SOLR
1.5500 ug | Freq: Once | INTRAMUSCULAR | Status: AC
  Administered 2015-11-18: 1.55 ug via INTRAVENOUS

## 2015-12-16 DIAGNOSIS — Z3009 Encounter for other general counseling and advice on contraception: Secondary | ICD-10-CM | POA: Diagnosis not present

## 2015-12-16 DIAGNOSIS — N939 Abnormal uterine and vaginal bleeding, unspecified: Secondary | ICD-10-CM | POA: Diagnosis not present

## 2015-12-16 DIAGNOSIS — Z01419 Encounter for gynecological examination (general) (routine) without abnormal findings: Secondary | ICD-10-CM | POA: Diagnosis not present

## 2015-12-16 DIAGNOSIS — Z1239 Encounter for other screening for malignant neoplasm of breast: Secondary | ICD-10-CM | POA: Diagnosis not present

## 2015-12-16 DIAGNOSIS — Z803 Family history of malignant neoplasm of breast: Secondary | ICD-10-CM | POA: Diagnosis not present

## 2015-12-16 DIAGNOSIS — L292 Pruritus vulvae: Secondary | ICD-10-CM | POA: Diagnosis not present

## 2015-12-16 LAB — HM PAP SMEAR: HM Pap smear: NORMAL

## 2015-12-26 ENCOUNTER — Other Ambulatory Visit: Payer: Self-pay

## 2015-12-26 ENCOUNTER — Encounter: Payer: Self-pay | Admitting: *Deleted

## 2015-12-26 NOTE — Patient Instructions (Addendum)
  Your procedure is scheduled on: 01-05-16 (THURSDAY) Report to [facility] SAME [facility] 2ND FLOOR To find out your arrival time please call [phone] between 1PM - 3PM on 01-04-16 [facility])  Remember: Instructions that are not followed completely may result in serious medical risk, up to and including death, or upon the discretion of your surgeon and anesthesiologist your surgery may need to be rescheduled.    _X___ 1. Do not eat food or drink liquids after midnight. No gum chewing or hard candies.     _X___ 2. No Alcohol for 24 hours before or after surgery.   ____ 3. Bring all medications with you on the day of surgery if instructed.    _X___ 4. Notify your doctor if there is any change in your medical condition     (cold, fever, infections).     Do not wear jewelry, make-up, hairpins, clips or nail polish.  Do not wear lotions, powders, or perfumes. You may wear deodorant.  Do not shave 48 hours prior to surgery. Men may shave face and neck.  Do not bring valuables to the hospital.    [facility] is not responsible for any belongings or valuables.               Contacts, dentures or bridgework may not be worn into surgery.  Leave your suitcase in the car. After surgery it may be brought to your room.  For patients admitted to the hospital, discharge time is determined by your  treatment team.   Patients discharged the day of surgery will not be allowed to drive home.   Please read over the following fact sheets that you were given:     _X___ Take these medicines the morning of surgery with A SIP OF WATER:    1. PROZAC  2.   3.   4.  5.  6.  ____ Fleet Enema (as directed)   ____ Use CHG Soap as directed  ____ Use inhalers on the day of surgery  ____ Stop metformin 2 days prior to surgery    ____ Take 1/2 of usual insulin dose the night before surgery and none on the morning of surgery.   ____ Stop Coumadin/Plavix/aspirin-NA/  ____ Stop  Anti-inflammatories-NO NSAIDS OR ASPIRIN PRODUCTS-TYLENOL OK TO TAKE   ____ Stop supplements until after surgery.    ____ Bring C-Pap to the hospital.

## 2015-12-30 ENCOUNTER — Encounter
Admission: RE | Admit: 2015-12-30 | Discharge: 2015-12-30 | Disposition: A | Discharge status: Home / Self Care | Payer: 59 | Source: Ambulatory Visit | Attending: Obstetrics & Gynecology | Admitting: Obstetrics & Gynecology

## 2015-12-30 DIAGNOSIS — N939 Abnormal uterine and vaginal bleeding, unspecified: Secondary | ICD-10-CM | POA: Diagnosis not present

## 2015-12-30 DIAGNOSIS — Z01812 Encounter for preprocedural laboratory examination: Secondary | ICD-10-CM | POA: Diagnosis not present

## 2015-12-30 LAB — CBC
HCT: 40.7 % (ref 35.0–47.0)
HEMOGLOBIN: 13.5 g/dL (ref 12.0–16.0)
MCH: 28.9 pg (ref 26.0–34.0)
MCHC: 33.1 g/dL (ref 32.0–36.0)
MCV: 87.1 fL (ref 80.0–100.0)
Platelets: 265 10*3/uL (ref 150–440)
RBC: 4.67 MIL/uL (ref 3.80–5.20)
RDW: 12.5 % (ref 11.5–14.5)
WBC: 7.6 10*3/uL (ref 3.6–11.0)

## 2015-12-30 LAB — BASIC METABOLIC PANEL
ANION GAP: 5 (ref 5–15)
BUN: 11 mg/dL (ref 6–20)
CALCIUM: 9 mg/dL (ref 8.9–10.3)
CHLORIDE: 106 mmol/L (ref 101–111)
CO2: 25 mmol/L (ref 22–32)
CREATININE: 0.76 mg/dL (ref 0.44–1.00)
GFR calc Af Amer: >60 mL/min (ref >60)
GFR calc non Af Amer: >60 mL/min (ref >60)
Glucose, Bld: 115 mg/dL — ABNORMAL HIGH (ref 65–99)
Potassium: 3.8 mmol/L (ref 3.5–5.1)
SODIUM: 136 mmol/L (ref 135–145)

## 2015-12-30 LAB — TYPE AND SCREEN
ABO/RH(D): O POS
Antibody Screen: NEGATIVE

## 2015-12-30 LAB — ABO/RH: ABO/RH(D): O POS

## 2016-01-05 ENCOUNTER — Encounter: Payer: Self-pay | Admitting: *Deleted

## 2016-01-05 ENCOUNTER — Ambulatory Visit: Payer: 59 | Admitting: Anesthesiology

## 2016-01-05 ENCOUNTER — Encounter
Admission: RE | Disposition: A | Discharge status: Home / Self Care | Payer: Self-pay | Source: Ambulatory Visit | Attending: Obstetrics & Gynecology

## 2016-01-05 ENCOUNTER — Ambulatory Visit
Admission: RE | Admit: 2016-01-05 | Discharge: 2016-01-05 | Disposition: A | Discharge status: Home / Self Care | Payer: 59 | Source: Ambulatory Visit | Attending: Obstetrics & Gynecology | Admitting: Obstetrics & Gynecology

## 2016-01-05 DIAGNOSIS — N920 Excessive and frequent menstruation with regular cycle: Secondary | ICD-10-CM | POA: Insufficient documentation

## 2016-01-05 DIAGNOSIS — Z880 Allergy status to penicillin: Secondary | ICD-10-CM | POA: Insufficient documentation

## 2016-01-05 DIAGNOSIS — Z803 Family history of malignant neoplasm of breast: Secondary | ICD-10-CM | POA: Diagnosis not present

## 2016-01-05 DIAGNOSIS — Z801 Family history of malignant neoplasm of trachea, bronchus and lung: Secondary | ICD-10-CM | POA: Insufficient documentation

## 2016-01-05 DIAGNOSIS — N939 Abnormal uterine and vaginal bleeding, unspecified: Secondary | ICD-10-CM | POA: Insufficient documentation

## 2016-01-05 DIAGNOSIS — Z793 Long term (current) use of hormonal contraceptives: Secondary | ICD-10-CM | POA: Insufficient documentation

## 2016-01-05 DIAGNOSIS — K219 Gastro-esophageal reflux disease without esophagitis: Secondary | ICD-10-CM | POA: Diagnosis not present

## 2016-01-05 DIAGNOSIS — F419 Anxiety disorder, unspecified: Secondary | ICD-10-CM | POA: Diagnosis not present

## 2016-01-05 DIAGNOSIS — E7800 Pure hypercholesterolemia, unspecified: Secondary | ICD-10-CM | POA: Insufficient documentation

## 2016-01-05 DIAGNOSIS — Z9851 Tubal ligation status: Secondary | ICD-10-CM | POA: Diagnosis not present

## 2016-01-05 DIAGNOSIS — N938 Other specified abnormal uterine and vaginal bleeding: Secondary | ICD-10-CM | POA: Diagnosis not present

## 2016-01-05 DIAGNOSIS — Z79899 Other long term (current) drug therapy: Secondary | ICD-10-CM | POA: Insufficient documentation

## 2016-01-05 DIAGNOSIS — Z8249 Family history of ischemic heart disease and other diseases of the circulatory system: Secondary | ICD-10-CM | POA: Insufficient documentation

## 2016-01-05 DIAGNOSIS — Z302 Encounter for sterilization: Secondary | ICD-10-CM | POA: Insufficient documentation

## 2016-01-05 HISTORY — PX: LAPAROSCOPIC TUBAL LIGATION: SHX1937

## 2016-01-05 HISTORY — PX: HYSTEROSCOPY WITH NOVASURE: SHX5574

## 2016-01-05 HISTORY — DX: Gastro-esophageal reflux disease without esophagitis: K21.9

## 2016-01-05 HISTORY — DX: Headache, unspecified: R51.9

## 2016-01-05 HISTORY — DX: Headache: R51

## 2016-01-05 LAB — POCT PREGNANCY, URINE: PREG TEST UR: NEGATIVE

## 2016-01-05 SURGERY — LIGATION, FALLOPIAN TUBE, LAPAROSCOPIC
Anesthesia: General | Wound class: Clean Contaminated

## 2016-01-05 MED ORDER — LACTATED RINGERS IV SOLN
INTRAVENOUS | Status: DC
  Administered 2016-01-05: 13:00:00 via INTRAVENOUS

## 2016-01-05 MED ORDER — OXYCODONE-ACETAMINOPHEN 5-325 MG PO TABS: 1.0000 | ORAL_TABLET | ORAL | Status: DC | PRN

## 2016-01-05 MED ORDER — FAMOTIDINE 20 MG PO TABS
ORAL_TABLET | ORAL | Status: AC
  Administered 2016-01-05: 20 mg via ORAL
  Filled 2016-01-05: qty 1

## 2016-01-05 MED ORDER — NEOSTIGMINE METHYLSULFATE 10 MG/10ML IV SOLN
INTRAVENOUS | Status: DC | PRN
  Administered 2016-01-05: 2.5 mg via INTRAVENOUS

## 2016-01-05 MED ORDER — GLYCOPYRROLATE 0.2 MG/ML IJ SOLN
INTRAMUSCULAR | Status: DC | PRN
  Administered 2016-01-05: 0.4 mg via INTRAVENOUS

## 2016-01-05 MED ORDER — IBUPROFEN 600 MG PO TABS: 600.0000 mg | ORAL_TABLET | Freq: Four times a day (QID) | ORAL | Status: DC | PRN

## 2016-01-05 MED ORDER — MIDAZOLAM HCL 2 MG/2ML IJ SOLN
INTRAMUSCULAR | Status: DC | PRN
  Administered 2016-01-05: 2 mg via INTRAVENOUS

## 2016-01-05 MED ORDER — ACETAMINOPHEN 325 MG PO TABS: 650.0000 mg | ORAL_TABLET | ORAL | Status: DC | PRN

## 2016-01-05 MED ORDER — LACTATED RINGERS IV SOLN
INTRAVENOUS | Status: DC | PRN
  Administered 2016-01-05 (×2): via INTRAVENOUS

## 2016-01-05 MED ORDER — KETOROLAC TROMETHAMINE 30 MG/ML IJ SOLN: 30.0000 mg | Freq: Four times a day (QID) | INTRAMUSCULAR | Status: DC

## 2016-01-05 MED ORDER — FENTANYL CITRATE (PF) 100 MCG/2ML IJ SOLN
INTRAMUSCULAR | Status: DC | PRN
  Administered 2016-01-05: 100 ug via INTRAVENOUS

## 2016-01-05 MED ORDER — ONDANSETRON HCL 4 MG/2ML IJ SOLN
INTRAMUSCULAR | Status: DC | PRN
  Administered 2016-01-05: 4 mg via INTRAVENOUS

## 2016-01-05 MED ORDER — ONDANSETRON HCL 4 MG/2ML IJ SOLN
INTRAMUSCULAR | Status: AC
  Filled 2016-01-05: qty 2

## 2016-01-05 MED ORDER — MORPHINE SULFATE (PF) 2 MG/ML IV SOLN: 1.0000 mg | INTRAVENOUS | Status: DC | PRN

## 2016-01-05 MED ORDER — LIDOCAINE HCL (CARDIAC) 20 MG/ML IV SOLN
INTRAVENOUS | Status: DC | PRN
  Administered 2016-01-05: 60 mg via INTRAVENOUS

## 2016-01-05 MED ORDER — FENTANYL CITRATE (PF) 100 MCG/2ML IJ SOLN
25.0000 ug | INTRAMUSCULAR | Status: AC | PRN
  Administered 2016-01-05 (×6): 25 ug via INTRAVENOUS

## 2016-01-05 MED ORDER — FENTANYL CITRATE (PF) 100 MCG/2ML IJ SOLN
INTRAMUSCULAR | Status: AC
  Administered 2016-01-05: 25 ug via INTRAVENOUS
  Filled 2016-01-05: qty 2

## 2016-01-05 MED ORDER — ROCURONIUM BROMIDE 100 MG/10ML IV SOLN
INTRAVENOUS | Status: DC | PRN
  Administered 2016-01-05: 30 mg via INTRAVENOUS

## 2016-01-05 MED ORDER — ACETAMINOPHEN 650 MG RE SUPP: 650.0000 mg | RECTAL | Status: DC | PRN

## 2016-01-05 MED ORDER — FAMOTIDINE 20 MG PO TABS
20.0000 mg | ORAL_TABLET | Freq: Once | ORAL | Status: AC
  Administered 2016-01-05: 20 mg via ORAL

## 2016-01-05 MED ORDER — SUCCINYLCHOLINE CHLORIDE 20 MG/ML IJ SOLN
INTRAMUSCULAR | Status: DC | PRN
  Administered 2016-01-05: 80 mg via INTRAVENOUS

## 2016-01-05 MED ORDER — KETOROLAC TROMETHAMINE 30 MG/ML IJ SOLN
INTRAMUSCULAR | Status: DC | PRN
  Administered 2016-01-05: 30 mg via INTRAVENOUS

## 2016-01-05 MED ORDER — PROPOFOL 10 MG/ML IV BOLUS
INTRAVENOUS | Status: DC | PRN
  Administered 2016-01-05: 120 mg via INTRAVENOUS

## 2016-01-05 MED ORDER — DEXAMETHASONE SODIUM PHOSPHATE 10 MG/ML IJ SOLN
INTRAMUSCULAR | Status: DC | PRN
  Administered 2016-01-05: 10 mg via INTRAVENOUS

## 2016-01-05 MED ORDER — ONDANSETRON HCL 4 MG/2ML IJ SOLN
4.0000 mg | Freq: Once | INTRAMUSCULAR | Status: AC | PRN
  Administered 2016-01-05: 4 mg via INTRAVENOUS

## 2016-01-05 SURGICAL SUPPLY — 37 items
BLADE SURG SZ11 CARB STEEL (BLADE) ×3 IMPLANT
CANISTER SUCT 3000ML (MISCELLANEOUS) ×3 IMPLANT
CATH ROBINSON RED A/P 16FR (CATHETERS) ×3 IMPLANT
CHLORAPREP W/TINT 26ML (MISCELLANEOUS) ×3 IMPLANT
DRSG TEGADERM 2-3/8X2-3/4 SM (GAUZE/BANDAGES/DRESSINGS) ×6 IMPLANT
GAUZE SPONGE NON-WVN 2X2 STRL (MISCELLANEOUS) ×2 IMPLANT
GLOVE BIOGEL PI IND STRL 6.5 (GLOVE) ×2 IMPLANT
GLOVE BIOGEL PI INDICATOR 6.5 (GLOVE) ×1
GLOVE INDICATOR 8.0 STRL GRN (GLOVE) ×3 IMPLANT
GLOVE SURG SYN 6.5 ES PF (GLOVE) ×3 IMPLANT
GOWN STRL REUS W/ TWL LRG LVL3 (GOWN DISPOSABLE) ×4 IMPLANT
GOWN STRL REUS W/ TWL XL LVL3 (GOWN DISPOSABLE) ×2 IMPLANT
GOWN STRL REUS W/TWL LRG LVL3 (GOWN DISPOSABLE) ×2
GOWN STRL REUS W/TWL XL LVL3 (GOWN DISPOSABLE) ×1
HEMOSTAT ARISTA ABSORB 3G PWDR (MISCELLANEOUS) ×3 IMPLANT
IV LACTATED RINGERS 1000ML (IV SOLUTION) ×3 IMPLANT
LABEL OR SOLS (LABEL) ×3 IMPLANT
LIGASURE BLUNT 5MM 37CM (INSTRUMENTS) ×3 IMPLANT
LIQUID BAND (GAUZE/BANDAGES/DRESSINGS) ×3 IMPLANT
MANIPULATOR UTERINE ZUMI 4.5 (MISCELLANEOUS) ×3 IMPLANT
NEEDLE VERESS 14GA 120MM (NEEDLE) ×3 IMPLANT
NOVASURE ENDOMETRIAL ABLATION (MISCELLANEOUS) ×3 IMPLANT
NS IRRIG 500ML POUR BTL (IV SOLUTION) ×3 IMPLANT
PACK DNC HYST (MISCELLANEOUS) ×3 IMPLANT
PACK GYN LAPAROSCOPIC (MISCELLANEOUS) ×3 IMPLANT
PAD OB MATERNITY 4.3X12.25 (PERSONAL CARE ITEMS) ×3 IMPLANT
PAD PREP 24X41 OB/GYN DISP (PERSONAL CARE ITEMS) ×6 IMPLANT
SLEEVE ENDOPATH XCEL 5M (ENDOMECHANICALS) ×3 IMPLANT
SPONGE VERSALON 2X2 STRL (MISCELLANEOUS) ×1
STRAP SAFETY BODY (MISCELLANEOUS) ×3 IMPLANT
SUT VIC AB 2-0 UR6 27 (SUTURE) ×3 IMPLANT
SUT VIC AB 4-0 PS2 18 (SUTURE) ×3 IMPLANT
SYR 50ML LL SCALE MARK (SYRINGE) ×3 IMPLANT
TROCAR ENDO BLADELESS 11MM (ENDOMECHANICALS) ×3 IMPLANT
TROCAR XCEL NON-BLD 5MMX100MML (ENDOMECHANICALS) ×3 IMPLANT
TUBING CONNECTING 10 (TUBING) ×3 IMPLANT
TUBING INSUFFLATOR HI FLOW (MISCELLANEOUS) ×3 IMPLANT

## 2016-01-05 NOTE — Transfer of Care (Signed)
Immediate Anesthesia Transfer of Care Note  Patient: Felicia Mcdowell  Procedure(s) Performed: Procedure(s): LAPAROSCOPIC TUBAL LIGATION (Bilateral) HYSTEROSCOPY WITH NOVASURE (N/A)  Patient Location: PACU  Anesthesia Type:General  Level of Consciousness: awake and alert   Airway & Oxygen Therapy: Patient Spontanous Breathing and Patient connected to face mask oxygen  Post-op Assessment: Report given to RN  Post vital signs: Reviewed and stable  Last Vitals:  Filed Vitals:   01/05/16 1510 01/05/16 1511  BP: 100/77 126/70  Pulse: 89 80  Temp: 36.3 C 36.4 C  Resp: 14 16    Complications: No apparent anesthesia complications

## 2016-01-05 NOTE — H&P (Signed)
H&P Update  PLEASE SEE PAPER H&P  Pt was last seen in my office, and complete history and physical performed.  The surgical history has been reviewed and remains accurate without interval change. The patient was re-examined and patient's physiologic condition has not changed significantly in the last 30 days.  No new pharmacological allergies or types of therapy has been initiated.  Allergies  Allergen Reactions  . Augmentin [Amoxicillin-Pot Clavulanate] Hives and Rash    Rash     Past Medical History  Diagnosis Date  . Anxiety   . GERD (gastroesophageal reflux disease)   . Headache    Past Surgical History  Procedure Laterality Date  . Tonsillectomy      Ht  (1.6 m)  Wt 77.111 kg (170 lb)  BMI 30.12 kg/m2  LMP 11/11/2015 (Approximate)  NAD RRR no murmurs CTAB, no wheezing, resps unlabored +BS, soft, NTTP No c/c/e Pelvic exam deferred  The above history was confirmed with the patient. The condition still exists that makes this procedure necessary. Surgical plan includes Laparoscopic Bilateral Salpingectomy and Novasure Endometrial Ablation, as confirmed on the consent. The treatment plan remains the same, without new options for care.  The patient understands the potential benefits and risks and the consents have been signed and placed on the chart.     Ranae Plumber, MD Attending Obstetrician Gynecologist [facility]

## 2016-01-05 NOTE — Anesthesia Preprocedure Evaluation (Signed)
Anesthesia Evaluation  Patient identified by MRN, date of birth, ID band Patient awake    Reviewed: Allergy & Precautions, NPO status , Patient's Chart, lab work & pertinent test results  History of Anesthesia Complications Negative for: history of anesthetic complications  Airway Mallampati: I       Dental  (+) Teeth Intact   Pulmonary neg pulmonary ROS,    breath sounds clear to auscultation       Cardiovascular negative cardio ROS   Rhythm:Regular Rate:Normal     Neuro/Psych    GI/Hepatic Neg liver ROS, GERD  ,  Endo/Other  negative endocrine ROS  Renal/GU negative Renal ROS  negative genitourinary   Musculoskeletal   Abdominal Normal abdominal exam  (+)   Peds negative pediatric ROS (+)  Hematology negative hematology ROS (+)   Anesthesia Other Findings   Reproductive/Obstetrics                             Anesthesia Physical Anesthesia Plan  ASA: I  Anesthesia Plan: General   Post-op Pain Management:    Induction: Intravenous  Airway Management Planned: Oral ETT  Additional Equipment:   Intra-op Plan:   Post-operative Plan: Extubation in OR  Informed Consent: I have reviewed the patients History and Physical, chart, labs and discussed the procedure including the risks, benefits and alternatives for the proposed anesthesia with the patient or authorized representative who has indicated his/her understanding and acceptance.     Plan Discussed with: CRNA  Anesthesia Plan Comments:         Anesthesia Quick Evaluation

## 2016-01-05 NOTE — Anesthesia Procedure Notes (Signed)
Procedure Name: Intubation Date/Time: 01/05/2016 1:37 PM Performed by: ZOXWRUE, Summer Mccolgan Patient Re-evaluated:Patient Re-evaluated prior to inductionOxygen Delivery Method: Circle system utilized Preoxygenation: Pre-oxygenation with 100% oxygen Intubation Type: IV induction Laryngoscope Size: Mac and 3 Grade View: Grade I Tube type: Oral Number of attempts: 1 Placement Confirmation: ETT inserted through vocal cords under direct vision,  breath sounds checked- equal and bilateral,  CO2 detector and positive ETCO2 Secured at: 21 cm Tube secured with: Tape

## 2016-01-05 NOTE — Discharge Instructions (Signed)
AMBULATORY SURGERY  DISCHARGE INSTRUCTIONS  1) The drugs that you were given will stay in your system until tomorrow so for the next 24 hours you should not:  A) Drive an automobile B) Make any legal decisions C) Drink any alcoholic beverage  2) You may resume regular meals tomorrow.  Today it is better to start with liquids and gradually work up to solid foods.  You may eat anything you prefer, but it is better to start with liquids, then soup and crackers, and gradually work up to solid foods.  3) Please notify your doctor immediately if you have any unusual bleeding, trouble breathing, redness and pain at the surgery site, drainage, fever, or pain not relieved by medication.  4) Additional Instructions:  Please contact your physician with any problems or Same Day Surgery at [phone], Monday through Friday 6 am to 4 pm, or [place] [facility] number at [phone].   Laparoscopic Tubal Ligation, Care After Refer to this sheet in the next few weeks. These instructions provide you with information about caring for yourself after your procedure. Your health care provider may also give you more specific instructions. Your treatment has been planned according to current medical practices, but problems sometimes occur. Call your health care provider if you have any problems or questions after your procedure. WHAT TO EXPECT AFTER THE PROCEDURE After your procedure, it is common to have:  Sore throat.  Soreness at the incision site.  Mild cramping.  Tiredness.  Mild nausea or vomiting.  Shoulder pain. HOME CARE INSTRUCTIONS  Rest for the remainder of the day.  Take medicines only as directed by your health care provider. These include over-the-counter medicines and prescription medicines. Do not take aspirin, which can cause bleeding.  Over the next few days, gradually return to your normal activities and your normal diet.  Avoid sexual intercourse for 2 weeks or  as directed by your health care provider.  Do not use tampons, and do not douche.  Do not drive or operate heavy machinery while taking pain medicine.  Do not lift anything that is heavier than 5 lb (2.3 kg) for 2 weeks or as directed by your health care provider.  Do not take baths. Take showers only. Ask your health care provider when you can start taking baths.  Take your temperature twice each day and write it down.  Try to have help for your household needs for the first 7-10 days.  There are many different ways to close and cover an incision, including stitches (sutures), skin glue, and adhesive strips. Follow instructions from your health care provider about:  Incision care.  Bandage (dressing) changes and removal.  Incision closure removal.  Check your incision area every day for signs of infection. Watch for:  Redness, swelling, or pain.  Fluid, blood, or pus.  Keep all follow-up visits as directed by your health care provider. SEEK MEDICAL CARE IF:  You have redness, swelling, or increasing pain in your incision area.  You have fluid, blood, or pus coming from your incision for longer than 1 day.  You notice a bad smell coming from your incision or your dressing.  The edges of your incision break open after the sutures have been removed.  Your pain does not decrease after 2-3 days.  You have a rash.  You repeatedly become dizzy or light-headed.  You have a reaction to your medicine.  Your pain medicine is not helping.  You are constipated. SEEK IMMEDIATE MEDICAL  CARE IF:  You have a fever.  You faint.  You have increasing pain in your abdomen.  You have severe pain in one or both of your shoulders.  You have bleeding or drainage from your suture sites or your vagina after surgery.  You have shortness of breath or have difficulty breathing.  You have chest pain or leg pain.  You have ongoing nausea, vomiting, or diarrhea.   This information  is not intended to replace advice given to you by your health care provider. Make sure you discuss any questions you have with your health care provider.   Document Released: 05/25/2005 Document Revised: 03/22/2015 Document Reviewed: 02/16/2012 Elsevier Interactive Patient Education [employer]

## 2016-01-06 ENCOUNTER — Encounter: Payer: Self-pay | Admitting: Obstetrics & Gynecology

## 2016-01-06 NOTE — Op Note (Addendum)
Felicia Mcdowell PROCEDURE DATE: 01/05/2016  PATIENT:  Felicia Mcdowell  35 y.o. female  PRE-OPERATIVE DIAGNOSIS:  ABNORMAL UTERINE BLEEDING, DESIRED PERMANENT STERILIZATION  POST-OPERATIVE DIAGNOSIS:  ABNORMAL UTERINE BLEEDING, DESIRED PERMANENT STERILIZATION, pelvic congestion  PROCEDURE:  Procedure(s): bilateral laparoscopic salpingectomy, endometrial abalation HYSTEROSCOPY WITH NOVASURE (N/A)  SURGEON:  Surgeon(s) and Role:    * Chelsea C Ward, MD - Primary  ANESTHESIA:  General via ET  I/O  Total I/O In: 2000 [I.V.:2000] Out: 150 [Urine:100; Blood:50]  FINDINGS:  Small uterus, normal ovaries and fallopian tubes bilaterally. Dilated and tortuous vessels in bilateral adnexa. Normal upper abdomen.  SPECIMEN: Right and Left fallopian tubes  COMPLICATIONS: none apparent  DISPOSITION: vital signs stable to PACU   Indication for Surgery: 35 y.o. who has tried many modalities for menorrhagia control, and who desires permanent sterilization.  Risks of surgery were discussed with the patient including but not limited to: bleeding which may require transfusion or reoperation; infection which may require antibiotics; injury to bowel, bladder, ureters or other surrounding organs; need for additional procedures including laparotomy, blood clot, incisional problems and other postoperative/anesthesia complications. Written informed consent was obtained.    FINDINGS:  Bimanual exam: small anteverted uterus with no adnexal masses.  Hysteroscopic:  Bilateral ostea patent, normal appearing uterus, Total length 7.5, width 2.9, uterine corpus length 4.5.  Laparoscopic: normal appearing tubes, ovaries, and uterus.  Pelvic congestion bilaterally with dilated and tortuous vessels in broad ligaments.   PROCEDURE IN DETAIL:  The patient had sequential compression devices applied to her lower extremities while in the preoperative area.  She was then taken to the operating room where general anesthesia was  administered and was found to be adequate.  She was placed in the dorsal lithotomy position, and was prepped and draped in a sterile manner.  A Foley catheter was inserted into her bladder and attached to constant drainage and a uterine manipulator was then advanced into the uterus after removal of her IUD.  After a surgical timeout was performed, attention was turned to the abdomen where an umbilical incision was made with the scalpel. A Veress needle was inserted and a drop test confirmed appropriate position.  Opening pressure was 4mm Hg, and the abdomen was insufflated to Hg carbon dioxide gas and adequate pneumoperitoneum was obtained. A 5mm trochar was inserted in the umbilical incision using a visiport method. A survey of the patient's pelvis and abdomen revealed the findings as mentioned above. A 5mm suprapubic trochar was placed under visualization.    The right fallopian tube was traced to its fimbriated end.  The Ligasure was used to seal and divide the right tube along the mesosalpinx to the cornua.  The site was hemostatic.  During this, the tissue was sticking to the Ligasure, after the seal and divide process.  The tube was removed through the trochar.  The left tube traced down to its fimbriated end, and then was sealed and divided with the same Ligasure at the cornua, and again the tissue was stuck to the Ligasure.  Slight manipulation of the Liagsure resulted in a tear of the tissue covering the dilated vessels in the left broad ligament.  This led to some bleeding.  A 5mm trochar was then inserted in the LLQ so a grasper could be inserted to remove the tissue from the Ligasure.  A new Ligasure device was obtained and used to both control bleeding with cautery and transect the remaining tube distally.  Blood supply to the uterus  and to the ovary were preserved.  The tube was removed and Arista thrombotic agent was used on bilateral adnexa for ensured hemostasis.    The operative site was  surveyed, and it was found to be hemostatic.  No intraoperative injury to surrounding organs was noted.  The abdomen was desufflated and all instruments were then removed from the patient's abdomen. The uterine manipulator was removed without complications.  All skin incisions were closed with surgical glue.  The attention was then turned to the uterus. The uterine manipulator was removed.   A speculum was placed in the vagina, and the cervix was grasped with a single tooth tenaculum and sounded to 8.0cm.  Pratt dilators were used sequentially to dilate the uterus to accommodate the hysteroscope.  This was inserted and a normal uterine body was noted, and cervical length of 3.5cm.  Upon removal, the NovaSure device was inserted and set to length of 4.5cm.  The width was noted to be 2.9cm.  The device was initiated and tested, the vacuum was ensured, and 40sec of heat was distributed to the endometrial tissues.  The device was removed and inspected for integrity, which was noted.  The hysteroscope was reinserted and diffuse cauterization was noted.  All instruments were removed.     The patient tolerated the procedures well.  All instruments, needles, and sponge counts were correct x 2. The patient was taken to the recovery room in stable condition.   ---- Ranae Plumber, MD Attending Obstetrician and Gynecologist [facility]

## 2016-01-06 NOTE — Anesthesia Postprocedure Evaluation (Signed)
Anesthesia Post Note  Patient: Felicia Mcdowell  Procedure(s) Performed: Procedure(s) (LRB): bilateral laparoscopic salpingectomy, endometrial abalation (Bilateral) HYSTEROSCOPY WITH NOVASURE (N/A)  Patient location during evaluation: PACU Anesthesia Type: General Level of consciousness: awake Pain management: satisfactory to patient Vital Signs Assessment: post-procedure vital signs reviewed and stable Respiratory status: nonlabored ventilation Cardiovascular status: stable Anesthetic complications: no    Last Vitals:  Filed Vitals:   01/05/16 1638 01/05/16 1719  BP: 105/58 106/61  Pulse: 89 78  Temp: 36.3 C   Resp: 14 14    Last Pain:  Filed Vitals:   01/05/16 1719  PainSc: 2                  VAN STAVEREN,Chalon Zobrist

## 2016-01-30 ENCOUNTER — Other Ambulatory Visit: Payer: Self-pay | Admitting: *Deleted

## 2016-01-30 MED ORDER — FLUOXETINE HCL 20 MG PO CAPS: 20.0000 mg | ORAL_CAPSULE | Freq: Every day | ORAL | Status: DC

## 2016-01-30 MED ORDER — SIMVASTATIN 20 MG PO TABS: ORAL_TABLET | ORAL | Status: DC

## 2016-03-01 ENCOUNTER — Ambulatory Visit (INDEPENDENT_AMBULATORY_CARE_PROVIDER_SITE_OTHER): Payer: 59 | Admitting: Family Medicine

## 2016-03-01 ENCOUNTER — Encounter: Payer: Self-pay | Admitting: Family Medicine

## 2016-03-01 VITALS — BP 118/84 | HR 101 | Temp 98.0°F | Ht 63.0 in | Wt 172.0 lb

## 2016-03-01 DIAGNOSIS — E785 Hyperlipidemia, unspecified: Secondary | ICD-10-CM

## 2016-03-01 DIAGNOSIS — F419 Anxiety disorder, unspecified: Secondary | ICD-10-CM

## 2016-03-01 DIAGNOSIS — R7309 Other abnormal glucose: Secondary | ICD-10-CM | POA: Diagnosis not present

## 2016-03-01 NOTE — Assessment & Plan Note (Signed)
Stable on current rx. Continue current dose and psychotherapy.

## 2016-03-01 NOTE — Progress Notes (Signed)
Pre visit review using our clinic review tool, if applicable. No additional management support is needed unless otherwise documented below in the visit note. 

## 2016-03-01 NOTE — Progress Notes (Signed)
Subjective:   Patient ID: Felicia Mcdowell, female    DOB: 05/28/1981, 35 y.o.   MRN: [id number]  Felicia Mcdowell is a pleasant 35 y.o. year old female who presents to clinic today with Annual Exam  on 03/01/2016  HPI: HLD- has been well controlled on current dose of zocor.  No myalgias.  Diet has not been great, has also gained weight.  Increased stress at home now that mother in law is living with them. Lab Results  Component Value Date   CHOL 125 06/09/2015   HDL 35.20* 06/09/2015   LDLCALC 73 06/09/2015   TRIG 86.0 06/09/2015   CHOLHDL 4 06/09/2015   Wt Readings from Last 3 Encounters:  03/01/16 172 lb (78.019 kg)  01/05/16 170 lb (77.111 kg)  04/11/15 171 lb 2 oz (77.622 kg)   Anxiety- she feels current dose of prozac is working well.  Rarely takes Ativan. Denies SI or HI.  Has not been tearful.  Sleeping well. Starting to see a therapist in [place].  Current Outpatient Prescriptions on File Prior to Visit  Medication Sig Dispense Refill  . FLUoxetine (PROZAC) 20 MG capsule Take 1 capsule (20 mg total) by mouth daily with lunch. 90 capsule 3  . ibuprofen (ADVIL,MOTRIN) 600 MG tablet Take 1 tablet (600 mg total) by mouth every 6 (six) hours as needed. 45 tablet 0  . LORazepam (ATIVAN) 0.5 MG tablet Take 1 tablet (0.5 mg total) by mouth daily as needed for anxiety. Take one by mouth as needed. 30 tablet 0  . simvastatin (ZOCOR) 20 MG tablet TAKE 1 TABLET BY MOUTH AT  BEDTIME 90 tablet 3   No current facility-administered medications on file prior to visit.    Allergies  Allergen Reactions  . Augmentin [Amoxicillin-Pot Clavulanate] Hives and Rash    Rash     Past Medical History  Diagnosis Date  . Anxiety   . GERD (gastroesophageal reflux disease)   . Headache     Past Surgical History  Procedure Laterality Date  . Tonsillectomy    . Laparoscopic tubal ligation Bilateral 01/05/2016    Procedure: bilateral laparoscopic salpingectomy, endometrial abalation;  Surgeon:  Elenora Fenderhelsea C Ward, MD;  Location: [facility] ORS;  Service: Gynecology;  Laterality: Bilateral;  . Hysteroscopy with novasure N/A 01/05/2016    Procedure: HYSTEROSCOPY WITH NOVASURE;  Surgeon: Elenora Fenderhelsea C Ward, MD;  Location: [facility] ORS;  Service: Gynecology;  Laterality: N/A;    Family History  Problem Relation Age of Onset  . Cancer Mother [id number]    breast CA, now with metastatic cancer  . Depression Mother   . Heart disease Father   . Hyperlipidemia Father     Social History   Social History  . Marital Status: Married    Spouse Name: N/A  . Number of Children: N/A  . Years of Education: N/A   Occupational History  . Not on file.   Social History Main Topics  . Smoking status: Never Smoker   . Smokeless tobacco: Never Used  . Alcohol Use: No  . Drug Use: No  . Sexual Activity: Not on file   Other Topics Concern  . 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            The PMH, PSH, Social History, Family History, Medications, and [facility], and have been updated if relevant.   Review of  Systems  Constitutional: Negative.   HENT: Negative.   Musculoskeletal: Negative.   Neurological: Negative.   Psychiatric/Behavioral: Negative.   All other systems reviewed and are negative.      Objective:    BP 118/84 mmHg  Pulse 101  Temp(Src) 98 F (36.7 C) (Oral)  Ht  (1.6 m)  Wt 172 lb (78.019 kg)  BMI 30.48 kg/m2  SpO2 99%   Physical Exam  Constitutional: She is oriented to person, place, and time. She appears well-developed and well-nourished. No distress.  HENT:  Head: Normocephalic.  Eyes: Conjunctivae are normal.  Neck: Normal range of motion.  Cardiovascular: Normal rate.   Pulmonary/Chest: Effort normal.  Neurological: She is alert and oriented to person, place, and time. No cranial nerve deficit.  Skin: Skin is warm and dry.  Psychiatric: She has a normal mood and  affect. Her behavior is normal. Judgment and thought content normal.  Nursing note and vitals reviewed.         Assessment & Plan:   HLD (hyperlipidemia) - Plan: Comprehensive metabolic panel, Lipid panel  Elevated hemoglobin A1c - Plan: Hemoglobin A1c  Anxiety No Follow-up on file.

## 2016-03-01 NOTE — Assessment & Plan Note (Signed)
Well controlled. No changes made to rxs. Repeat labs in June 2017- orders entered.

## 2016-04-28 ENCOUNTER — Emergency Department
Admission: EM | Admit: 2016-04-28 | Discharge: 2016-04-28 | Disposition: A | Discharge status: Home / Self Care | Payer: 59 | Attending: Emergency Medicine | Admitting: Emergency Medicine

## 2016-04-28 DIAGNOSIS — K2970 Gastritis, unspecified, without bleeding: Secondary | ICD-10-CM | POA: Diagnosis not present

## 2016-04-28 DIAGNOSIS — R1013 Epigastric pain: Secondary | ICD-10-CM | POA: Diagnosis not present

## 2016-04-28 DIAGNOSIS — E785 Hyperlipidemia, unspecified: Secondary | ICD-10-CM | POA: Insufficient documentation

## 2016-04-28 DIAGNOSIS — Z8719 Personal history of other diseases of the digestive system: Secondary | ICD-10-CM | POA: Insufficient documentation

## 2016-04-28 DIAGNOSIS — Z79899 Other long term (current) drug therapy: Secondary | ICD-10-CM | POA: Insufficient documentation

## 2016-04-28 HISTORY — DX: Pure hypercholesterolemia, unspecified: E78.00

## 2016-04-28 LAB — CBC
HCT: 39.9 % (ref 35.0–47.0)
Hemoglobin: 13.4 g/dL (ref 12.0–16.0)
MCH: 29.2 pg (ref 26.0–34.0)
MCHC: 33.7 g/dL (ref 32.0–36.0)
MCV: 86.6 fL (ref 80.0–100.0)
PLATELETS: 258 10*3/uL (ref 150–440)
RBC: 4.61 MIL/uL (ref 3.80–5.20)
RDW: 12.6 % (ref 11.5–14.5)
WBC: 10.5 10*3/uL (ref 3.6–11.0)

## 2016-04-28 LAB — COMPREHENSIVE METABOLIC PANEL
ALT: 19 U/L (ref 14–54)
AST: 22 U/L (ref 15–41)
Albumin: 4.4 g/dL (ref 3.5–5.0)
Alkaline Phosphatase: 67 U/L (ref 38–126)
Anion gap: 6 (ref 5–15)
BILIRUBIN TOTAL: 0.4 mg/dL (ref 0.3–1.2)
BUN: 12 mg/dL (ref 6–20)
CHLORIDE: 105 mmol/L (ref 101–111)
CO2: 28 mmol/L (ref 22–32)
CREATININE: 0.81 mg/dL (ref 0.44–1.00)
Calcium: 9 mg/dL (ref 8.9–10.3)
Glucose, Bld: 123 mg/dL — ABNORMAL HIGH (ref 65–99)
Potassium: 4.1 mmol/L (ref 3.5–5.1)
Sodium: 139 mmol/L (ref 135–145)
TOTAL PROTEIN: 7.3 g/dL (ref 6.5–8.1)

## 2016-04-28 LAB — URINALYSIS COMPLETE WITH MICROSCOPIC (ARMC ONLY)
BILIRUBIN URINE: NEGATIVE
GLUCOSE, UA: NEGATIVE mg/dL
HGB URINE DIPSTICK: NEGATIVE
KETONES UR: NEGATIVE mg/dL
Nitrite: NEGATIVE
PH: 5 (ref 5.0–8.0)
Protein, ur: NEGATIVE mg/dL
SPECIFIC GRAVITY, URINE: 1.027 (ref 1.005–1.030)

## 2016-04-28 LAB — LIPASE, BLOOD: LIPASE: 33 U/L (ref 11–51)

## 2016-04-28 MED ORDER — CIPROFLOXACIN HCL 500 MG PO TABS: 500.0000 mg | ORAL_TABLET | Freq: Two times a day (BID) | ORAL | Status: DC

## 2016-04-28 MED ORDER — GI COCKTAIL ~~LOC~~
30.0000 mL | Freq: Once | ORAL | Status: AC
  Administered 2016-04-28: 30 mL via ORAL
  Filled 2016-04-28: qty 30

## 2016-04-28 MED ORDER — SUCRALFATE 1 G PO TABS: 1.0000 g | ORAL_TABLET | Freq: Four times a day (QID) | ORAL | Status: DC

## 2016-04-28 NOTE — ED Notes (Signed)
Pt reports to ED w/ c/o epigastric abd pain that began last night.  Pt denies n/v/d.  Pt ambulatory to room, resp even and unlabored. NAD.

## 2016-04-28 NOTE — ED Notes (Signed)
  Reviewed d/c instructions, follow-up care, and prescriptions with pt. Pt verbalized understanding 

## 2016-04-28 NOTE — ED Notes (Signed)
MD Kinner at bedside  

## 2016-04-28 NOTE — Discharge Instructions (Signed)

## 2016-04-28 NOTE — ED Provider Notes (Signed)
[facility] Emergency Department Provider Note  ____________________________________________    I have reviewed the triage vital signs and the nursing notes.   HISTORY  Chief Complaint Abdominal Pain    HPI Felicia Mcdowell is a 35 y.o. female who presents with complaints of burning epigastric pain that began last night. She reports she has had this in the past and it lasted several days before finally improving. She denies alcohol abuse. She denies NSAID abuse. She has had a full gallbladder workup in the past and reports her gallbladder was normal. She denies fevers or chills. No diarrhea.     Past Medical History  Diagnosis Date  . Anxiety   . GERD (gastroesophageal reflux disease)   . Headache   . High cholesterol     Patient Active Problem List   Diagnosis Date Noted  . Elevated hemoglobin A1c 06/09/2015  . HLD (hyperlipidemia) 03/29/2014  . Family history of early CAD 02/19/2013  . Anxiety     Past Surgical History  Procedure Laterality Date  . Tonsillectomy    . Laparoscopic tubal ligation Bilateral 01/05/2016    Procedure: bilateral laparoscopic salpingectomy, endometrial abalation;  Surgeon: Elenora Fender Ward, MD;  Location: [facility] ORS;  Service: Gynecology;  Laterality: Bilateral;  . Hysteroscopy with novasure N/A 01/05/2016    Procedure: HYSTEROSCOPY WITH NOVASURE;  Surgeon: Elenora Fender Ward, MD;  Location: [facility] ORS;  Service: Gynecology;  Laterality: N/A;    Current Outpatient Rx  Name  Route  Sig  Dispense  Refill  . FLUoxetine (PROZAC) 20 MG capsule   Oral   Take 1 capsule (20 mg total) by mouth daily with lunch.   90 capsule   3   . ibuprofen (ADVIL,MOTRIN) 600 MG tablet   Oral   Take 1 tablet (600 mg total) by mouth every 6 (six) hours as needed.   45 tablet   0   . LORazepam (ATIVAN) 0.5 MG tablet   Oral   Take 1 tablet (0.5 mg total) by mouth daily as needed for anxiety. Take one by mouth as needed.   30 tablet   0   .  simvastatin (ZOCOR) 20 MG tablet      TAKE 1 TABLET BY MOUTH AT  BEDTIME   90 tablet   3   . sucralfate (CARAFATE) 1 g tablet   Oral   Take 1 tablet (1 g total) by mouth 4 (four) times daily.   60 tablet   1     Allergies Augmentin  Family History  Problem Relation Age of Onset  . Cancer Mother 60    breast CA, now with metastatic cancer  . Depression Mother   . Heart disease Father   . Hyperlipidemia Father     Social History Social History  Substance Use Topics  . Smoking status: Never Smoker   . Smokeless tobacco: Never Used  . Alcohol Use: No    Review of Systems  Constitutional: Negative for fever.  ENT: Negative for sore throat Cardiovascular: Negative for chest pain Respiratory: Negative for shortness of breath. Gastrointestinal: As above Genitourinary: Negative for dysuria.    Psychiatric: no anxiety    ____________________________________________   PHYSICAL EXAM:  VITAL SIGNS: ED Triage Vitals  Enc Vitals Group     BP 04/28/16 2123 154/98 mmHg     Pulse Rate 04/28/16 2123 89     Resp --      Temp 04/28/16 2123 98.3 F (36.8 C)     Temp  Source 04/28/16 2123 Oral     SpO2 04/28/16 2123 99 %     Weight 04/28/16 2123 179 lb (81.194 kg)     Height 04/28/16 2123 5\' 3"  (1.6 m)     Head Cir --      Peak Flow --      Pain Score 04/28/16 2120 7     Pain Loc --      Pain Edu? --      Excl. in GC? --     Constitutional: Alert and oriented. Well appearing and in no distress.  Eyes: Conjunctivae are normal. No erythema or injection ENT   Head: Normocephalic and atraumatic.   Mouth/Throat: Mucous membranes are moist. Cardiovascular: Normal rate, regular rhythm.  Respiratory: Normal respiratory effort without tachypnea nor retractions. Gastrointestinal: Mild epigastric tenderness.. No distention. There is no CVA tenderness. Genitourinary: deferred Musculoskeletal: Nontender with normal range of motion in all extremities. No lower  extremity tenderness nor edema. Neurologic:  Normal speech and language. No gross focal neurologic deficits are appreciated. Skin:  Skin is warm, dry and intact. No rash noted. Psychiatric: Mood and affect are normal. Patient exhibits appropriate insight and judgment.  ____________________________________________    LABS (pertinent positives/negatives)  Labs Reviewed  COMPREHENSIVE METABOLIC PANEL - Abnormal; Notable for the following:    Glucose, Bld 123 (*)    All other components within normal limits  URINALYSIS COMPLETEWITH MICROSCOPIC ([facility] ONLY) - Abnormal; Notable for the following:    Color, Urine YELLOW (*)    APPearance CLOUDY (*)    Leukocytes, UA 3+ (*)    Bacteria, UA RARE (*)    Squamous Epithelial / LPF 6-30 (*)    All other components within normal limits  LIPASE, BLOOD  CBC    ____________________________________________   EKG  ED ECG REPORT I, Jene EveryKINNER, Dariona Postma, the attending physician, personally viewed and interpreted this ECG.  Date: 04/28/2016 EKG Time: 922 Rate: 86 Rhythm: normal sinus rhythm QRS Axis: normal Intervals: normal ST/T Wave abnormalities: normal Conduction Disturbances: none Narrative Interpretation: unremarkable   ____________________________________________    RADIOLOGY  None  ____________________________________________   PROCEDURES  Procedure(s) performed: none  Critical Care performed: none  ____________________________________________   INITIAL IMPRESSION / ASSESSMENT AND PLAN / ED COURSE  Pertinent labs & imaging results that were available during my care of the patient were reviewed by me and considered in my medical decision making (see chart for details).  Patient presents with burning epigastric pain. She has no significant tenderness to palpation over her right upper quadrant. I suspect gastritis. We will give GI cocktail and reevaluate.  Lab work is unremarkable except for urinary tract infection  which I discussed with the patient  Patient significant improvement after GI cocktail. We will treat her with Carafate and PPI and have her follow up with GI, she agrees with this plan. Return precautions discussed.  ____________________________________________   FINAL CLINICAL IMPRESSION(S) / ED DIAGNOSES  Final diagnoses:  Gastritis          Jene Everyobert Orpheus [place], MD 04/28/16 2314

## 2016-04-30 ENCOUNTER — Other Ambulatory Visit (INDEPENDENT_AMBULATORY_CARE_PROVIDER_SITE_OTHER): Payer: 59

## 2016-04-30 ENCOUNTER — Other Ambulatory Visit: Payer: Self-pay | Admitting: Family Medicine

## 2016-04-30 DIAGNOSIS — R1013 Epigastric pain: Secondary | ICD-10-CM

## 2016-04-30 DIAGNOSIS — R7309 Other abnormal glucose: Secondary | ICD-10-CM | POA: Diagnosis not present

## 2016-04-30 DIAGNOSIS — E785 Hyperlipidemia, unspecified: Secondary | ICD-10-CM

## 2016-04-30 HISTORY — DX: Epigastric pain: R10.13

## 2016-04-30 LAB — LIPID PANEL
Cholesterol: 112 mg/dL (ref 0–200)
HDL: 31.4 mg/dL — ABNORMAL LOW (ref >39.00)
LDL Cholesterol: 63 mg/dL (ref 0–99)
NonHDL: 80.24
TRIGLYCERIDES: 87 mg/dL (ref 0.0–149.0)
Total CHOL/HDL Ratio: 4
VLDL: 17.4 mg/dL (ref 0.0–40.0)

## 2016-04-30 LAB — H. PYLORI ANTIBODY, IGG: H PYLORI IGG: NEGATIVE

## 2016-04-30 LAB — HEMOGLOBIN A1C: Hgb A1c MFr Bld: 5.6 % (ref 4.6–6.5)

## 2016-05-28 LAB — SURGICAL PATHOLOGY

## 2016-10-01 ENCOUNTER — Other Ambulatory Visit: Payer: Self-pay | Admitting: *Deleted

## 2016-10-01 MED ORDER — SIMVASTATIN 20 MG PO TABS: ORAL_TABLET | ORAL | 0 refills | Status: DC

## 2016-10-01 MED ORDER — FLUOXETINE HCL 20 MG PO CAPS: 20.0000 mg | ORAL_CAPSULE | Freq: Every day | ORAL | 0 refills | Status: DC

## 2016-12-10 ENCOUNTER — Other Ambulatory Visit: Payer: Self-pay | Admitting: Family Medicine

## 2016-12-10 MED ORDER — SIMVASTATIN 20 MG PO TABS: ORAL_TABLET | ORAL | 1 refills | Status: DC

## 2016-12-10 MED ORDER — SIMVASTATIN 20 MG PO TABS
ORAL_TABLET | ORAL | 3 refills | Status: DC
Start: 2016-12-10 — End: 2017-08-23

## 2016-12-10 MED ORDER — BUPROPION HCL ER (SR) 150 MG PO TB12: 150.0000 mg | ORAL_TABLET | Freq: Two times a day (BID) | ORAL | 3 refills | Status: DC

## 2016-12-10 NOTE — Addendum Note (Signed)
Addended by: Desmond DikeKNIGHT, Shameer Molstad H on: 12/10/2016 10:42 AM   Modules accepted: Orders

## 2016-12-25 ENCOUNTER — Encounter (INDEPENDENT_AMBULATORY_CARE_PROVIDER_SITE_OTHER): Payer: Self-pay | Admitting: Family Medicine

## 2017-01-02 ENCOUNTER — Ambulatory Visit (INDEPENDENT_AMBULATORY_CARE_PROVIDER_SITE_OTHER): Payer: 59 | Admitting: Family Medicine

## 2017-01-02 ENCOUNTER — Other Ambulatory Visit: Payer: Self-pay | Admitting: *Deleted

## 2017-01-02 ENCOUNTER — Encounter (INDEPENDENT_AMBULATORY_CARE_PROVIDER_SITE_OTHER): Payer: Self-pay | Admitting: Family Medicine

## 2017-01-02 VITALS — BP 126/80 | HR 81 | Temp 98.2°F | Resp 14 | Ht 63.0 in | Wt 185.0 lb

## 2017-01-02 DIAGNOSIS — Z1389 Encounter for screening for other disorder: Secondary | ICD-10-CM | POA: Diagnosis not present

## 2017-01-02 DIAGNOSIS — Z0289 Encounter for other administrative examinations: Secondary | ICD-10-CM

## 2017-01-02 DIAGNOSIS — Z6832 Body mass index (BMI) 32.0-32.9, adult: Secondary | ICD-10-CM

## 2017-01-02 DIAGNOSIS — Z1331 Encounter for screening for depression: Secondary | ICD-10-CM

## 2017-01-02 DIAGNOSIS — R0602 Shortness of breath: Secondary | ICD-10-CM

## 2017-01-02 DIAGNOSIS — R5383 Other fatigue: Secondary | ICD-10-CM | POA: Diagnosis not present

## 2017-01-02 DIAGNOSIS — Z9189 Other specified personal risk factors, not elsewhere classified: Secondary | ICD-10-CM

## 2017-01-02 DIAGNOSIS — E669 Obesity, unspecified: Secondary | ICD-10-CM | POA: Diagnosis not present

## 2017-01-02 DIAGNOSIS — R7303 Prediabetes: Secondary | ICD-10-CM

## 2017-01-02 DIAGNOSIS — E785 Hyperlipidemia, unspecified: Secondary | ICD-10-CM

## 2017-01-02 MED ORDER — FLUOXETINE HCL 20 MG PO CAPS: 20.0000 mg | ORAL_CAPSULE | Freq: Every day | ORAL | 3 refills | Status: DC

## 2017-01-02 NOTE — Progress Notes (Signed)
Office: [phone]  /  Fax: [phone]   HPI:   Chief Complaint: OBESITY  Felicia Mcdowell (MR# [id number]) is a 36 y.o. female who presents on 01/02/2017 for obesity evaluation and treatment. Current BMI is Body mass index is 32.77 kg/m.Marland Kitchen Felicia Mcdowell has struggled with obesity for years and has been unsuccessful in either losing weight or maintaining long term weight loss. Felicia Mcdowell attended our information session and states she is currently in the action stage of change and ready to dedicate time achieving and maintaining a healthier weight.  Felicia Mcdowell states her desired weight is 140 she has been heavy most of  her life she started gaining weight after kids her heaviest weight ever was 185 lbs. she is a picky eater and doesn't like to eat healthier foods  she has significant food cravings issues  she snacks frequently in the evenings she frequently makes poor food choices she frequently eats larger portions than normal  she has binge eating behaviors she struggles with emotional eating    Fatigue Felicia Mcdowell feels her energy is lower than it should be. This has worsened with weight gain and has not worsened recently. Staysha denies daytime somnolence and  admits to waking up still tired. Patient is at risk for obstructive sleep apnea. Patent has a history of symptoms of morning fatigue. Patient generally gets 8 hours of sleep per night, and states they generally have generally restful sleep. Snoring is not present. Apneic episodes are not present. Epworth Sleepiness Score is 4  Dyspnea on exertion Felicia Mcdowell notes increasing shortness of breath with exercising and seems to be worsening over time with weight gain. She notes getting out of breath sooner with activity than she used to. This has not gotten worse recently. Brita denies orthopnea.  Hyperlipidemia Felicia Mcdowell has hyperlipidemia and has been trying to improve her cholesterol levels with intensive lifestyle modification including a low saturated  fat diet, exercise and weight loss. She denies any chest pain, claudication or myalgias.  Pre-Diabetes Felicia Mcdowell has a diagnosis of prediabetes based on her elevated HgA1c and was informed this puts her at greater risk of developing diabetes. She is not taking metformin currently and continues to work on diet and exercise to decrease risk of diabetes. She denies nausea or hypoglycemia.  At risk for diabetes Felicia Mcdowell is at higher than averagerisk for developing diabetes due to her obesity. She currently denies polyuria or polydipsia.  Depression Screen Felicia Mcdowell's Food and Mood (modified PHQ-9) score was  Depression screen PHQ 2/9 01/02/2017  Decreased Interest 1  Down, Depressed, Hopeless 1  PHQ - 2 Score 2  Altered sleeping 0  Tired, decreased energy 3  Change in appetite 3  Feeling bad or failure about yourself  1  Trouble concentrating 1  Moving slowly or fidgety/restless 0  Suicidal thoughts 0  PHQ-9 Score 10    ALLERGIES: Allergies  Allergen Reactions  . Augmentin [Amoxicillin-Pot Clavulanate] Hives and Rash    Rash     MEDICATIONS: Current Outpatient Prescriptions on File Prior to Visit  Medication Sig Dispense Refill  . buPROPion (WELLBUTRIN SR) 150 MG 12 hr tablet Take 1 tablet (150 mg total) by mouth 2 (two) times daily. 60 tablet 3  . ibuprofen (ADVIL,MOTRIN) 600 MG tablet Take 1 tablet (600 mg total) by mouth every 6 (six) hours as needed. 45 tablet 0  . simvastatin (ZOCOR) 20 MG tablet TAKE 1 TABLET BY MOUTH AT  BEDTIME 30 tablet 3   No current facility-administered medications on file prior to visit.  PAST MEDICAL HISTORY: Past Medical History:  Diagnosis Date  . Anxiety   . Back pain   . Constipation   . GERD (gastroesophageal reflux disease)   . Headache   . High cholesterol   . Prediabetes     PAST SURGICAL HISTORY: Past Surgical History:  Procedure Laterality Date  . HYSTEROSCOPY WITH NOVASURE N/A 01/05/2016   Procedure: HYSTEROSCOPY WITH NOVASURE;   Surgeon: Felicia Fender Ward, MD;  Location: [facility] ORS;  Service: Gynecology;  Laterality: N/A;  . LAPAROSCOPIC TUBAL LIGATION Bilateral 01/05/2016   Procedure: bilateral laparoscopic salpingectomy, endometrial abalation;  Surgeon: Felicia Fender Ward, MD;  Location: [facility] ORS;  Service: Gynecology;  Laterality: Bilateral;  . TONSILLECTOMY      SOCIAL HISTORY: Social History  Substance Use Topics  . Smoking status: Never Smoker  . Smokeless tobacco: Never Used  . Alcohol use No    FAMILY HISTORY: Family History  Problem Relation Age of Onset  . Cancer Mother 48    breast CA, now with metastatic cancer  . Depression Mother   . Anxiety disorder Mother   . Heart disease Father   . Hyperlipidemia Father   . Hypertension Father   . Sudden death Father   . Hypertension Maternal Grandmother   . Kidney disease Maternal Grandmother     ROS: Review of Systems  Constitutional: Positive for malaise/fatigue.  Respiratory: Positive for shortness of breath (exercise induced).   Cardiovascular: Negative for chest pain, orthopnea and claudication.  Gastrointestinal: Negative for nausea.  Genitourinary: Negative for frequency.  Musculoskeletal: Negative for myalgias.  Endo/Heme/Allergies: Negative for polydipsia.       Negative hypoglycemia    PHYSICAL EXAM: Blood pressure 126/80, pulse 81, temperature 98.2 F (36.8 C), temperature source Oral, resp. rate 14, height 5\' 3"  (1.6 m), weight 185 lb (83.9 kg), SpO2 97 %. Body mass index is 32.77 kg/m. Physical Exam  Constitutional: She is oriented to person, place, and time. She appears well-developed and well-nourished.  Cardiovascular: Normal rate.   Pulmonary/Chest: Effort normal.  Musculoskeletal: Normal range of motion.  Neurological: She is oriented to person, place, and time.  Skin: Skin is warm and dry.  Psychiatric: She has a normal mood and affect. Her behavior is normal.    RECENT LABS AND TESTS: BMET    Component Value Date/Time    NA 139 04/28/2016 2122   K 4.1 04/28/2016 2122   CL 105 04/28/2016 2122   CO2 28 04/28/2016 2122   GLUCOSE 123 (H) 04/28/2016 2122   BUN 12 04/28/2016 2122   CREATININE 0.81 04/28/2016 2122   CALCIUM 9.0 04/28/2016 2122   GFRNONAA >60 04/28/2016 2122   GFRAA >60 04/28/2016 2122   Lab Results  Component Value Date   HGBA1C 5.6 04/30/2016   No results found for: INSULIN CBC    Component Value Date/Time   WBC 10.5 04/28/2016 2122   RBC 4.61 04/28/2016 2122   HGB 13.4 04/28/2016 2122   HCT 39.9 04/28/2016 2122   PLT 258 04/28/2016 2122   MCV 86.6 04/28/2016 2122   MCH 29.2 04/28/2016 2122   MCHC 33.7 04/28/2016 2122   RDW 12.6 04/28/2016 2122   LYMPHSABS 3.0 04/11/2015 1511   MONOABS 0.6 04/11/2015 1511   EOSABS 0.1 04/11/2015 1511   BASOSABS 0.0 04/11/2015 1511   Iron/TIBC/Ferritin/ %Sat No results found for: IRON, TIBC, FERRITIN, IRONPCTSAT Lipid Panel     Component Value Date/Time   CHOL 112 04/30/2016 1216   TRIG 87.0 04/30/2016 1216   HDL  31.40 (L) 04/30/2016 1216   CHOLHDL 4 04/30/2016 1216   VLDL 17.4 04/30/2016 1216   LDLCALC 63 04/30/2016 1216   Hepatic Function Panel     Component Value Date/Time   PROT 7.3 04/28/2016 2122   ALBUMIN 4.4 04/28/2016 2122   AST 22 04/28/2016 2122   ALT 19 04/28/2016 2122   ALKPHOS 67 04/28/2016 2122   BILITOT 0.4 04/28/2016 2122      Component Value Date/Time   TSH 0.33 (L) 02/24/2014 1217    ECG  shows NSR with a rate of 83 BPM INDIRECT CALORIMETER done today shows a VO2 of 242 and a REE of 1688.    ASSESSMENT AND PLAN: Fatigue, unspecified type - Plan: EKG 12-Lead, CBC With Differential, Comprehensive metabolic panel, T3, T4, free, TSH, VITAMIN D 25 Hydroxy (Vit-D Deficiency, Fractures), Folate, Vitamin B12  Shortness of breath on exertion  Prediabetes - Plan: Hemoglobin A1c, Insulin, random  Hyperlipidemia, unspecified hyperlipidemia type - Plan: Lipid Panel With LDL/HDL Ratio  At risk for diabetes  mellitus - Plan: Hemoglobin A1c, Insulin, random  Depression screening  Class 1 obesity without serious comorbidity with body mass index (BMI) of 32.0 to 32.9 in adult, unspecified obesity type  PLAN:  Fatigue Felicia Mcdowell was informed that her fatigue may be related to obesity, depression or many other causes. Labs will be ordered, and in the meanwhile Felicia Mcdowell has agreed to work on diet, exercise and weight loss to help with fatigue. Proper sleep hygiene was discussed including the need for 7-8 hours of quality sleep each night. A sleep study was not ordered based on symptoms and Epworth score.  Dyspnea on exertion Felicia Mcdowell's shortness of breath appears to be obesity related and exercise induced. She has agreed to work on weight loss and gradually increase exercise to treat her exercise induced shortness of breath. If Felicia Mcdowell follows our instructions and loses weight without improvement of her shortness of breath, we will plan to refer to pulmonology. We will monitor this condition regularly. Felicia Mcdowell agrees to this plan.  Hyperlipidemia Felicia Mcdowell was informed of the American Heart Association Guidelines emphasizing intensive lifestyle modifications as the first line treatment for hyperlipidemia. We discussed many lifestyle modifications today in depth, and Felicia Mcdowell will continue to work on decreasing saturated fats such as fatty red meat, butter and many fried foods. She will also increase vegetables and lean protein in her diet and continue to work on exercise and weight loss efforts.  Pre-Diabetes Felicia Mcdowell will continue to work on weight loss, exercise, and decreasing simple carbohydrates in her diet to help decrease the risk of diabetes. Check labs and follow.  Diabetes risk counselling Felicia Mcdowell was given extended (at least 15 minutes) diabetes prevention counseling today. She is 36 y.o. female and has risk factors for diabetes including obesity. We discussed intensive lifestyle modifications today with an  emphasis on weight loss as well as increasing exercise and decreasing simple carbohydrates in her diet.  Depression Screen Felicia Mcdowell had a positive depression screening. Depression is commonly associated with obesity and often results in emotional eating behaviors. We will monitor this closely and work on CBT to help improve the non-hunger eating patterns. Referral to Psychology may be required if no improvement is seen as she continues in our clinic.  Obesity Felicia Mcdowell is currently in the action stage of change and her goal is to continue with weight loss efforts She has agreed to follow the Category 2 plan Felicia Mcdowell has been instructed to work up to a goal of 150 minutes  of combined cardio and strengthening exercise per week for weight loss and overall health benefits. We discussed the following Behavioral Modification Stratagies today: increasing lean protein intake, increasing lower sugar fruits, work on meal planning and easy cooking plans and dealing with family or coworker sabotage  Nylene has agreed to follow up with our clinic in 2 weeks. She was informed of the importance of frequent follow up visits to maximize her success with intensive lifestyle modifications for her multiple health conditions. She was informed we would discuss her lab results at her next visit unless there is a critical issue that needs to be addressed sooner. Jennice agreed to keep her next visit at the agreed upon time to discuss these results.  I, [place] Crane, am acting as scribe for Quillian Quince, MD  I have reviewed the above documentation for accuracy and completeness, and I agree with the above. -Quillian Quince, MD

## 2017-01-03 LAB — LIPID PANEL WITH LDL/HDL RATIO
CHOLESTEROL TOTAL: 114 mg/dL (ref 100–199)
HDL: 39 mg/dL — AB (ref >39)
LDL Calculated: 65 mg/dL (ref 0–99)
LDl/HDL Ratio: 1.7 ratio units (ref 0.0–3.2)
Triglycerides: 52 mg/dL (ref 0–149)
VLDL Cholesterol Cal: 10 mg/dL (ref 5–40)

## 2017-01-03 LAB — COMPREHENSIVE METABOLIC PANEL
ALBUMIN: 4.6 g/dL (ref 3.5–5.5)
ALT: 13 IU/L (ref 0–32)
AST: 13 IU/L (ref 0–40)
Albumin/Globulin Ratio: 1.6 (ref 1.2–2.2)
Alkaline Phosphatase: 93 IU/L (ref 39–117)
BILIRUBIN TOTAL: 0.6 mg/dL (ref 0.0–1.2)
BUN / CREAT RATIO: 10 (ref 9–23)
BUN: 8 mg/dL (ref 6–20)
CALCIUM: 9.2 mg/dL (ref 8.7–10.2)
CO2: 25 mmol/L (ref 18–29)
CREATININE: 0.78 mg/dL (ref 0.57–1.00)
Chloride: 99 mmol/L (ref 96–106)
GFR, EST AFRICAN AMERICAN: 116 mL/min/{1.73_m2} (ref >59)
GFR, EST NON AFRICAN AMERICAN: 100 mL/min/{1.73_m2} (ref >59)
GLUCOSE: 106 mg/dL — AB (ref 65–99)
Globulin, Total: 2.9 g/dL (ref 1.5–4.5)
Potassium: 4.3 mmol/L (ref 3.5–5.2)
Sodium: 140 mmol/L (ref 134–144)
TOTAL PROTEIN: 7.5 g/dL (ref 6.0–8.5)

## 2017-01-03 LAB — CBC WITH DIFFERENTIAL
BASOS ABS: 0 10*3/uL (ref 0.0–0.2)
Basos: 1 %
EOS (ABSOLUTE): 0.1 10*3/uL (ref 0.0–0.4)
Eos: 2 %
HEMOGLOBIN: 13.5 g/dL (ref 11.1–15.9)
Hematocrit: 40.1 % (ref 34.0–46.6)
IMMATURE GRANS (ABS): 0 10*3/uL (ref 0.0–0.1)
Immature Granulocytes: 0 %
LYMPHS: 29 %
Lymphocytes Absolute: 2.2 10*3/uL (ref 0.7–3.1)
MCH: 28.4 pg (ref 26.6–33.0)
MCHC: 33.7 g/dL (ref 31.5–35.7)
MCV: 84 fL (ref 79–97)
MONOCYTES: 6 %
Monocytes Absolute: 0.4 10*3/uL (ref 0.1–0.9)
NEUTROS ABS: 4.8 10*3/uL (ref 1.4–7.0)
Neutrophils: 62 %
RBC: 4.75 x10E6/uL (ref 3.77–5.28)
RDW: 12.5 % (ref 12.3–15.4)
WBC: 7.5 10*3/uL (ref 3.4–10.8)

## 2017-01-03 LAB — FOLATE: Folate: 14 ng/mL (ref >3.0)

## 2017-01-03 LAB — T3: T3 TOTAL: 153 ng/dL (ref 71–180)

## 2017-01-03 LAB — VITAMIN D 25 HYDROXY (VIT D DEFICIENCY, FRACTURES): VIT D 25 HYDROXY: 28.2 ng/mL — AB (ref 30.0–100.0)

## 2017-01-03 LAB — INSULIN, RANDOM: INSULIN: 15.2 u[IU]/mL (ref 2.6–24.9)

## 2017-01-03 LAB — HEMOGLOBIN A1C
Est. average glucose Bld gHb Est-mCnc: 114 mg/dL
Hgb A1c MFr Bld: 5.6 % (ref 4.8–5.6)

## 2017-01-03 LAB — T4, FREE: FREE T4: 1.29 ng/dL (ref 0.82–1.77)

## 2017-01-03 LAB — VITAMIN B12: VITAMIN B 12: 530 pg/mL (ref 232–1245)

## 2017-01-03 LAB — TSH: TSH: 0.698 u[IU]/mL (ref 0.450–4.500)

## 2017-01-14 ENCOUNTER — Encounter (INDEPENDENT_AMBULATORY_CARE_PROVIDER_SITE_OTHER): Payer: Self-pay | Admitting: Family Medicine

## 2017-01-14 ENCOUNTER — Ambulatory Visit (INDEPENDENT_AMBULATORY_CARE_PROVIDER_SITE_OTHER): Payer: 59 | Admitting: Family Medicine

## 2017-01-14 VITALS — BP 111/78 | HR 95 | Temp 98.4°F | Ht 63.0 in | Wt 177.0 lb

## 2017-01-14 DIAGNOSIS — R7303 Prediabetes: Secondary | ICD-10-CM | POA: Diagnosis not present

## 2017-01-14 DIAGNOSIS — Z6831 Body mass index (BMI) 31.0-31.9, adult: Secondary | ICD-10-CM

## 2017-01-14 DIAGNOSIS — Z9189 Other specified personal risk factors, not elsewhere classified: Secondary | ICD-10-CM | POA: Diagnosis not present

## 2017-01-14 DIAGNOSIS — F3289 Other specified depressive episodes: Secondary | ICD-10-CM

## 2017-01-14 DIAGNOSIS — E669 Obesity, unspecified: Secondary | ICD-10-CM

## 2017-01-14 DIAGNOSIS — E559 Vitamin D deficiency, unspecified: Secondary | ICD-10-CM | POA: Diagnosis not present

## 2017-01-14 MED ORDER — VITAMIN D (ERGOCALCIFEROL) 1.25 MG (50000 UNIT) PO CAPS: 50000.0000 [IU] | ORAL_CAPSULE | ORAL | 0 refills | Status: DC

## 2017-01-14 NOTE — Progress Notes (Signed)
Office: [phone]  /  Fax: [phone]   HPI:   Chief Complaint: OBESITY Felicia KocherRegina is here to discuss her progress with her obesity treatment plan. She is following her eating plan approximately 95 % of the time and states she is doing gymnastics for exercise 60 minutes 1 time per week. Felicia KocherRegina has done well with weight loss. She has found the plan to be somewhat difficult. Hunger and cravings were controlled. She is somewhat bored with dinner and would like to make it more interesting. She is doing well with avoiding temptations. Her weight is 177 lb (80.3 kg) today and has had a weight loss of 8 pounds over a period of 2 weeks since her last visit. She has lost 8 lbs since starting treatment with us.  Vitamin D deficiency Felicia KocherRegina has a diagnosis of vitamin D deficiency. She is currently taking vit D and is not yet at goal. She admits fatigue and denies nausea, vomiting or muscle weakness.  Pre-Diabetes Felicia KocherRegina has a diagnosis of prediabetes based on her slightly elevated Hgb A1c at 5.6 but fasting glucose elevated and fasting insulin >5 and was informed this puts her at greater risk of developing diabetes. She is not taking metformin currently and continues to work on diet and exercise to decrease risk of diabetes. Polyphagia is controlled on low simple carbohydrate diet. She denies nausea or hypoglycemia.  At risk for diabetes Felicia KocherRegina is at higher than average risk for developing diabetes due to her obesity. She currently denies polyuria or polydipsia.  Depression with emotional eating behaviors Felicia KocherRegina is struggling with emotional eating and using food for comfort to the extent that it is negatively impacting her health. She often snacks when she is not hungry. Felicia KocherRegina sometimes feels she is out of control and then feels guilty that she made poor food choices. She has been working on behavior modification techniques to help reduce her emotional eating and notes decreased emotional eating. She  denies insomnia. She shows no sign of suicidal or homicidal ideations.  Depression screen PHQ 2/9 01/02/2017  Decreased Interest 1  Down, Depressed, Hopeless 1  PHQ - 2 Score 2  Altered sleeping 0  Tired, decreased energy 3  Change in appetite 3  Feeling bad or failure about yourself  1  Trouble concentrating 1  Moving slowly or fidgety/restless 0  Suicidal thoughts 0  PHQ-9 Score 10     Wt Readings from Last 500 Encounters:  01/14/17 177 lb (80.3 kg)  01/02/17 185 lb (83.9 kg)  04/28/16 179 lb (81.2 kg)  03/01/16 172 lb (78 kg)  01/05/16 170 lb (77.1 kg)  04/11/15 171 lb 2 oz (77.6 kg)  03/02/15 178 lb (80.7 kg)  02/24/14 166 lb 6 oz (75.5 kg)  02/22/14 166 lb 8 oz (75.5 kg)  02/19/13 174 lb (78.9 kg)     ALLERGIES: Allergies  Allergen Reactions   Augmentin [Amoxicillin-Pot Clavulanate] Hives and Rash    Rash     MEDICATIONS: Current Outpatient Prescriptions on File Prior to Visit  Medication Sig Dispense Refill   buPROPion (WELLBUTRIN SR) 150 MG 12 hr tablet Take 1 tablet (150 mg total) by mouth 2 (two) times daily. 60 tablet 3   FLUoxetine (PROZAC) 20 MG capsule Take 1 capsule (20 mg total) by mouth daily. 90 capsule 3   ibuprofen (ADVIL,MOTRIN) 600 MG tablet Take 1 tablet (600 mg total) by mouth every 6 (six) hours as needed. 45 tablet 0   simvastatin (ZOCOR) 20 MG tablet TAKE 1  TABLET BY MOUTH AT  BEDTIME 30 tablet 3   No current facility-administered medications on file prior to visit.     PAST MEDICAL HISTORY: Past Medical History:  Diagnosis Date   Anxiety    Back pain    Constipation    GERD (gastroesophageal reflux disease)    Headache    High cholesterol    Prediabetes     PAST SURGICAL HISTORY: Past Surgical History:  Procedure Laterality Date   HYSTEROSCOPY WITH NOVASURE N/A 01/05/2016   Procedure: HYSTEROSCOPY WITH NOVASURE;  Surgeon: Elenora Fender Ward, MD;  Location: [facility];  Service: Gynecology;  Laterality: N/A;    LAPAROSCOPIC TUBAL LIGATION Bilateral 01/05/2016   Procedure: bilateral laparoscopic salpingectomy, endometrial abalation;  Surgeon: Elenora Fender Ward, MD;  Location: [facility];  Service: Gynecology;  Laterality: Bilateral;   TONSILLECTOMY      SOCIAL HISTORY: Social History  Substance Use Topics   Smoking status: Never Smoker   Smokeless tobacco: Never Used   Alcohol use No    FAMILY HISTORY: Family History  Problem Relation Age of Onset   Cancer Mother 26    breast CA, now with metastatic cancer   Depression Mother    Anxiety disorder Mother    Heart disease Father    Hyperlipidemia Father    Hypertension Father    Sudden death Father    Hypertension Maternal Grandmother    Kidney disease Maternal Grandmother     ROS: Review of Systems  Constitutional: Positive for malaise/fatigue and weight loss.  Gastrointestinal: Negative for nausea and vomiting.  Genitourinary: Negative for frequency.  Musculoskeletal:       Negative Muscle Weakness  Endo/Heme/Allergies: Negative for polydipsia.       Hypoglycemia  Psychiatric/Behavioral: Positive for depression. Negative for suicidal ideas. The patient does not have insomnia.     PHYSICAL EXAM: Blood pressure 111/78, pulse 95, temperature 98.4 F (36.9 C), temperature source Oral, height 5\' 3"  (1.6 m), weight 177 lb (80.3 kg), SpO2 95 %. Body mass index is 31.35 kg/m. Physical Exam  Constitutional: She is oriented to person, place, and time. She appears well-developed and well-nourished.  Cardiovascular: Normal rate.   Pulmonary/Chest: Effort normal.  Musculoskeletal: Normal range of motion.  Neurological: She is oriented to person, place, and time.  Skin: Skin is warm and dry.  Vitals reviewed.   RECENT LABS AND TESTS: BMET    Component Value Date/Time   NA 140 01/02/2017 0946   K 4.3 01/02/2017 0946   CL 99 01/02/2017 0946   CO2 25 01/02/2017 0946   GLUCOSE 106 (H) 01/02/2017 0946   GLUCOSE 123 (H)  04/28/2016 2122   BUN 8 01/02/2017 0946   CREATININE 0.78 01/02/2017 0946   CALCIUM 9.2 01/02/2017 0946   GFRNONAA 100 01/02/2017 0946   GFRAA 116 01/02/2017 0946   Lab Results  Component Value Date   HGBA1C 5.6 01/02/2017   HGBA1C 5.6 04/30/2016   HGBA1C 5.4 06/09/2015   Lab Results  Component Value Date   INSULIN 15.2 01/02/2017   CBC    Component Value Date/Time   WBC 7.5 01/02/2017 0946   WBC 10.5 04/28/2016 2122   RBC 4.75 01/02/2017 0946   RBC 4.61 04/28/2016 2122   HGB 13.4 04/28/2016 2122   HCT 40.1 01/02/2017 0946   PLT 258 04/28/2016 2122   MCV 84 01/02/2017 0946   MCH 28.4 01/02/2017 0946   MCH 29.2 04/28/2016 2122   MCHC 33.7 01/02/2017 0946   MCHC 33.7 04/28/2016 2122  RDW 12.5 01/02/2017 0946   LYMPHSABS 2.2 01/02/2017 0946   MONOABS 0.6 04/11/2015 1511   EOSABS 0.1 01/02/2017 0946   BASOSABS 0.0 01/02/2017 0946   Iron/TIBC/Ferritin/ %Sat No results found for: IRON, TIBC, FERRITIN, IRONPCTSAT Lipid Panel     Component Value Date/Time   CHOL 114 01/02/2017 0946   TRIG 52 01/02/2017 0946   HDL 39 (L) 01/02/2017 0946   CHOLHDL 4 04/30/2016 1216   VLDL 17.4 04/30/2016 1216   LDLCALC 65 01/02/2017 0946   Hepatic Function Panel     Component Value Date/Time   PROT 7.5 01/02/2017 0946   ALBUMIN 4.6 01/02/2017 0946   AST 13 01/02/2017 0946   ALT 13 01/02/2017 0946   ALKPHOS 93 01/02/2017 0946   BILITOT 0.6 01/02/2017 0946      Component Value Date/Time   TSH 0.698 01/02/2017 0946   TSH 0.33 (L) 02/24/2014 1217    ASSESSMENT AND PLAN: Vitamin D deficiency - Plan: Vitamin D, Ergocalciferol, (DRISDOL) 50000 units CAPS capsule  Prediabetes  Other depression  At risk for diabetes mellitus  Class 1 obesity without serious comorbidity with body mass index (BMI) of 31.0 to 31.9 in adult, unspecified obesity type  PLAN:  Vitamin D Deficiency Ashyra was informed that low vitamin D levels contributes to fatigue and are associated with  obesity, breast, and colon cancer. She agrees to start to take prescription Vit D @50 ,000 IU every week #4 with no refills and will follow up for routine testing of vitamin D, at least 2-3 times per year. She was informed of the risk of over-replacement of vitamin D and agrees to not increase her dose unless he discusses this with [place] first.  Pre-Diabetes Brookie will continue to work on weight loss, exercise, and decreasing simple carbohydrates in her diet to help decrease the risk of diabetes. We dicussed metformin including benefits and risks. She was informed that eating too many simple carbohydrates or too many calories at one sitting increases the likelihood of GI side effects. Keyetta declined metformin for now and a prescription was not written today. Lessly agreed to follow up with [place] as directed to monitor her progress.  Diabetes risk counselling Ritisha was given extended (at least 30 minutes) diabetes prevention counseling today. She is 36 y.o. female and has risk factors for diabetes including obesity. We discussed intensive lifestyle modifications today with an emphasis on weight loss as well as increasing exercise and decreasing simple carbohydrates in her diet.  Depression with Emotional Eating Behaviors We discussed behavior modification techniques today to help Marai deal with her emotional eating and depression. She has agreed to take Wellbutrin SR 150 mg 1 tablet by mouth 2 times a day and agreed to follow up in 2 weeks as directed.  Obesity Mical is currently in the action stage of change. As such, her goal is to continue with weight loss efforts She has agreed to follow the Category 2 plan +100 calories Taron has been instructed to work up to a goal of 150 minutes of combined cardio and strengthening exercise per week for weight loss and overall health benefits. We discussed the following Behavioral Modification Stratagies today: increasing lean protein intake, decreasing simple  carbohydrates , work on meal planning and easy cooking plans and dealing with family or coworker sabotage  Skyelar has agreed to follow up with our clinic in 2 weeks. She was informed of the importance of frequent follow up visits to maximize her success with intensive lifestyle modifications for her  multiple health conditions.  I, [place] Crane, am acting as scribe for Quillian Quince, MD  I have reviewed the above documentation for accuracy and completeness, and I agree with the above. -Quillian Quince, MD

## 2017-01-29 ENCOUNTER — Ambulatory Visit (INDEPENDENT_AMBULATORY_CARE_PROVIDER_SITE_OTHER): Payer: 59 | Admitting: Family Medicine

## 2017-01-29 VITALS — BP 118/83 | HR 93 | Temp 98.4°F | Ht 63.0 in | Wt 173.0 lb

## 2017-01-29 DIAGNOSIS — K5900 Constipation, unspecified: Secondary | ICD-10-CM | POA: Diagnosis not present

## 2017-01-29 DIAGNOSIS — Z6830 Body mass index (BMI) 30.0-30.9, adult: Secondary | ICD-10-CM | POA: Diagnosis not present

## 2017-01-29 DIAGNOSIS — E669 Obesity, unspecified: Secondary | ICD-10-CM | POA: Diagnosis not present

## 2017-01-29 DIAGNOSIS — F3289 Other specified depressive episodes: Secondary | ICD-10-CM

## 2017-01-29 MED ORDER — POLYETHYLENE GLYCOL 3350 17 G PO PACK: 17.0000 g | PACK | Freq: Every day | ORAL | 0 refills | Status: AC

## 2017-01-30 NOTE — Progress Notes (Signed)
Office: [phone]  /  Fax: [phone]   HPI:   Chief Complaint: OBESITY Felicia Mcdowell is here to discuss her progress with her obesity treatment plan. She is following her eating plan approximately 75 % of the time and states she is exercising 60 minutes 1 time per week. Felicia Mcdowell continues to do well with weight loss. She struggled with meal planning, especially on weekends and while traveling. There is decreased sabotage at home and at work. Fatigue is improved. Her weight is 173 lb (78.5 kg) today and has had a weight loss of 4 pounds over a period of 2 weeks since her last visit. She has lost 12 lbs since starting treatment with [place].  Depression with emotional eating behaviors Felicia Mcdowell is struggling with emotional eating and using food for comfort to the extent that it is negatively impacting her health. She often snacks when she is not hungry. Felicia Mcdowell sometimes feels she is out of control and then feels guilty that she made poor food choices. She is currently stable on Wellbutrin and Prozac and has decreased emotional eating. She has been working on behavior modification techniques to help reduce her emotional eating and has been somewhat successful. Blood pressure is controlled and she denies insomnia. She shows no sign of suicidal or homicidal ideations.  Constipation Felicia Mcdowell notes constipation for the last few weeks, worse since attempting weight loss. She still notes 1 to 2 bowel movements per week that are painful. She denies hematochezia or melena. She reports drinking less H20 recently.  Depression screen [facility] 2/9 01/02/2017  Decreased Interest 1  Down, Depressed, Hopeless 1  PHQ - 2 Score 2  Altered sleeping 0  Tired, decreased energy 3  Change in appetite 3  Feeling bad or failure about yourself  1  Trouble concentrating 1  Moving slowly or fidgety/restless 0  Suicidal thoughts 0  PHQ-9 Score 10     Wt Readings from Last 500 Encounters:  01/29/17 173 lb (78.5 kg)  01/14/17 177  lb (80.3 kg)  01/02/17 185 lb (83.9 kg)  04/28/16 179 lb (81.2 kg)  03/01/16 172 lb (78 kg)  01/05/16 170 lb (77.1 kg)  04/11/15 171 lb 2 oz (77.6 kg)  03/02/15 178 lb (80.7 kg)  02/24/14 166 lb 6 oz (75.5 kg)  02/22/14 166 lb 8 oz (75.5 kg)  02/19/13 174 lb (78.9 kg)     ALLERGIES: Allergies  Allergen Reactions  . Augmentin [Amoxicillin-Pot Clavulanate] Hives and Rash    Rash     MEDICATIONS: Current Outpatient Prescriptions on File Prior to Visit  Medication Sig Dispense Refill  . buPROPion (WELLBUTRIN SR) 150 MG 12 hr tablet Take 1 tablet (150 mg total) by mouth 2 (two) times daily. 60 tablet 3  . FLUoxetine (PROZAC) 20 MG capsule Take 1 capsule (20 mg total) by mouth daily. 90 capsule 3  . ibuprofen (ADVIL,MOTRIN) 600 MG tablet Take 1 tablet (600 mg total) by mouth every 6 (six) hours as needed. 45 tablet 0  . simvastatin (ZOCOR) 20 MG tablet TAKE 1 TABLET BY MOUTH AT  BEDTIME 30 tablet 3  . Vitamin D, Ergocalciferol, (DRISDOL) 50000 units CAPS capsule Take 1 capsule (50,000 Units total) by mouth every 7 (seven) days. 4 capsule 0   No current facility-administered medications on file prior to visit.     PAST MEDICAL HISTORY: Past Medical History:  Diagnosis Date  . Anxiety   . Back pain   . Constipation   . GERD (gastroesophageal reflux disease)   .  Headache   . High cholesterol   . Prediabetes     PAST SURGICAL HISTORY: Past Surgical History:  Procedure Laterality Date  . HYSTEROSCOPY WITH NOVASURE N/A 01/05/2016   Procedure: HYSTEROSCOPY WITH NOVASURE;  Surgeon: Elenora Fenderhelsea C Ward, MD;  Location: [facility];  Service: Gynecology;  Laterality: N/A;  . LAPAROSCOPIC TUBAL LIGATION Bilateral 01/05/2016   Procedure: bilateral laparoscopic salpingectomy, endometrial abalation;  Surgeon: Elenora Fenderhelsea C Ward, MD;  Location: [facility];  Service: Gynecology;  Laterality: Bilateral;  . TONSILLECTOMY      SOCIAL HISTORY: Social History  Substance Use Topics  . Smoking status:  Never Smoker  . Smokeless tobacco: Never Used  . Alcohol use No    FAMILY HISTORY: Family History  Problem Relation Age of Onset  . Cancer Mother [id number]    breast CA, now with metastatic cancer  . Depression Mother   . Anxiety disorder Mother   . Heart disease Father   . Hyperlipidemia Father   . Hypertension Father   . Sudden death Father   . Hypertension Maternal Grandmother   . Kidney disease Maternal Grandmother     ROS: Review of Systems  Constitutional: Positive for malaise/fatigue and weight loss.  Gastrointestinal: Positive for constipation. Negative for melena.  Psychiatric/Behavioral: Negative for suicidal ideas. The patient does not have insomnia.     PHYSICAL EXAM: Blood pressure 118/83, pulse 93, temperature 98.4 F (36.9 C), temperature source Oral, height 5\' 3"  (1.6 m), weight 173 lb (78.5 kg), SpO2 96 %. Body mass index is 30.65 kg/m. Physical Exam  Constitutional: She is oriented to person, place, and time. She appears well-developed and well-nourished.  Cardiovascular: Normal rate.   Pulmonary/Chest: Effort normal.  Musculoskeletal: Normal range of motion.  Neurological: She is oriented to person, place, and time.  Skin: Skin is warm and dry.  Vitals reviewed.   RECENT LABS AND TESTS: BMET    Component Value Date/Time   NA 140 01/02/2017 0946   K 4.3 01/02/2017 0946   CL 99 01/02/2017 0946   CO2 25 01/02/2017 0946   GLUCOSE 106 (H) 01/02/2017 0946   GLUCOSE 123 (H) 04/28/2016 2122   BUN 8 01/02/2017 0946   CREATININE 0.78 01/02/2017 0946   CALCIUM 9.2 01/02/2017 0946   GFRNONAA 100 01/02/2017 0946   GFRAA 116 01/02/2017 0946   Lab Results  Component Value Date   HGBA1C 5.6 01/02/2017   HGBA1C 5.6 04/30/2016   HGBA1C 5.4 06/09/2015   Lab Results  Component Value Date   INSULIN 15.2 01/02/2017   CBC    Component Value Date/Time   WBC 7.5 01/02/2017 0946   WBC 10.5 04/28/2016 2122   RBC 4.75 01/02/2017 0946   RBC 4.61 04/28/2016  2122   HGB 13.4 04/28/2016 2122   HCT 40.1 01/02/2017 0946   PLT 258 04/28/2016 2122   MCV 84 01/02/2017 0946   MCH 28.4 01/02/2017 0946   MCH 29.2 04/28/2016 2122   MCHC 33.7 01/02/2017 0946   MCHC 33.7 04/28/2016 2122   RDW 12.5 01/02/2017 0946   LYMPHSABS 2.2 01/02/2017 0946   MONOABS 0.6 04/11/2015 1511   EOSABS 0.1 01/02/2017 0946   BASOSABS 0.0 01/02/2017 0946   Iron/TIBC/Ferritin/ %Sat No results found for: IRON, TIBC, FERRITIN, IRONPCTSAT Lipid Panel     Component Value Date/Time   CHOL 114 01/02/2017 0946   TRIG 52 01/02/2017 0946   HDL 39 (L) 01/02/2017 0946   CHOLHDL 4 04/30/2016 1216   VLDL 17.4 04/30/2016 1216   LDLCALC  65 01/02/2017 0946   Hepatic Function Panel     Component Value Date/Time   PROT 7.5 01/02/2017 0946   ALBUMIN 4.6 01/02/2017 0946   AST 13 01/02/2017 0946   ALT 13 01/02/2017 0946   ALKPHOS 93 01/02/2017 0946   BILITOT 0.6 01/02/2017 0946      Component Value Date/Time   TSH 0.698 01/02/2017 0946   TSH 0.33 (L) 02/24/2014 1217    ASSESSMENT AND PLAN: Constipation, unspecified constipation type - Plan: polyethylene glycol (MIRALAX / GLYCOLAX) packet  Other depression  Class 1 obesity without serious comorbidity with body mass index (BMI) of 30.0 to 30.9 in adult, unspecified obesity type  PLAN:  Depression with Emotional Eating Behaviors We discussed behavior modification techniques today to help Felicia Mcdowell deal with her emotional eating and depression. She has agreed continue to take Wellbutrin and Prozac and agreed to follow up as directed.  Constipation Felicia Mcdowell was informed decrease bowel movement frequency is normal while losing weight, but stools should not be hard or painful. She was advised to increase her H20 intake and work on increasing her fiber intake. High fiber foods were discussed today. She agrees to continue to take Miralax 17 grams per day #30 with no refills.  Obesity Felicia Mcdowell is currently in the action stage of  change. As such, her goal is to continue with weight loss efforts She has agreed to follow the Category 2 plan Felicia Mcdowell has been instructed to work up to a goal of 150 minutes of combined cardio and strengthening exercise per week for weight loss and overall health benefits. We discussed the following Behavioral Modification Stratagies today: increasing lean protein intake, increasing lower sugar fruits, increasing fiber rich foods and water, work on meal planning and easy cooking plans and emotional eating strategies  Felicia Mcdowell has agreed to follow up with our clinic in 2 weeks. She was informed of the importance of frequent follow up visits to maximize her success with intensive lifestyle modifications for her multiple health conditions.  I, [place] Crane, am acting as scribe for Quillian Quince, MD  I have reviewed the above documentation for accuracy and completeness, and I agree with the above. -Quillian Quince, MD

## 2017-02-12 ENCOUNTER — Ambulatory Visit (INDEPENDENT_AMBULATORY_CARE_PROVIDER_SITE_OTHER): Payer: 59 | Admitting: Family Medicine

## 2017-02-12 VITALS — BP 122/88 | HR 121 | Temp 98.4°F | Ht 63.0 in | Wt 170.0 lb

## 2017-02-12 DIAGNOSIS — Z6830 Body mass index (BMI) 30.0-30.9, adult: Secondary | ICD-10-CM

## 2017-02-12 DIAGNOSIS — E669 Obesity, unspecified: Secondary | ICD-10-CM | POA: Diagnosis not present

## 2017-02-12 DIAGNOSIS — E663 Overweight: Secondary | ICD-10-CM | POA: Insufficient documentation

## 2017-02-12 DIAGNOSIS — E559 Vitamin D deficiency, unspecified: Secondary | ICD-10-CM | POA: Diagnosis not present

## 2017-02-12 MED ORDER — VITAMIN D (ERGOCALCIFEROL) 1.25 MG (50000 UNIT) PO CAPS: 50000.0000 [IU] | ORAL_CAPSULE | ORAL | 0 refills | Status: DC

## 2017-02-12 NOTE — Progress Notes (Signed)
Office: [phone]  /  Fax: [phone]   HPI:   Chief Complaint: OBESITY Felicia Mcdowell is here to discuss her progress with her obesity treatment plan. She is following her eating plan approximately 50 % of the time and states she is exercising 60 minutes 1 times per week. Felicia Mcdowell was worried that she deviated from her plan too much but is still doing well with weight loss. She notes somewhat more support at home but still struggling with some temptations. Her weight is 170 lb (77.1 kg) today and has had a weight loss of 3 pounds over a period of 2 weeks since her last visit. She has lost 15 lbs since starting treatment with [place].  Vitamin D deficiency Felicia Mcdowell has a diagnosis of vitamin D deficiency. She is currently taking vit D, not yet at goal and denies nausea, vomiting or muscle weakness.  Wt Readings from Last 500 Encounters:  02/12/17 170 lb (77.1 kg)  01/29/17 173 lb (78.5 kg)  01/14/17 177 lb (80.3 kg)  01/02/17 185 lb (83.9 kg)  04/28/16 179 lb (81.2 kg)  03/01/16 172 lb (78 kg)  01/05/16 170 lb (77.1 kg)  04/11/15 171 lb 2 oz (77.6 kg)  03/02/15 178 lb (80.7 kg)  02/24/14 166 lb 6 oz (75.5 kg)  02/22/14 166 lb 8 oz (75.5 kg)  02/19/13 174 lb (78.9 kg)     ALLERGIES: Allergies  Allergen Reactions  . Augmentin [Amoxicillin-Pot Clavulanate] Hives and Rash    Rash     MEDICATIONS: Current Outpatient Prescriptions on File Prior to Visit  Medication Sig Dispense Refill  . buPROPion (WELLBUTRIN SR) 150 MG 12 hr tablet Take 1 tablet (150 mg total) by mouth 2 (two) times daily. 60 tablet 3  . FLUoxetine (PROZAC) 20 MG capsule Take 1 capsule (20 mg total) by mouth daily. 90 capsule 3  . ibuprofen (ADVIL,MOTRIN) 600 MG tablet Take 1 tablet (600 mg total) by mouth every 6 (six) hours as needed. 45 tablet 0  . polyethylene glycol (MIRALAX / GLYCOLAX) packet Take 17 g by mouth daily. 30 each 0  . Probiotic Product (ALIGN) 4 MG CAPS Take 4 mg by mouth daily.    . simvastatin  (ZOCOR) 20 MG tablet TAKE 1 TABLET BY MOUTH AT  BEDTIME 30 tablet 3  . Vitamin D, Ergocalciferol, (DRISDOL) 50000 units CAPS capsule Take 1 capsule (50,000 Units total) by mouth every 7 (seven) days. 4 capsule 0   No current facility-administered medications on file prior to visit.     PAST MEDICAL HISTORY: Past Medical History:  Diagnosis Date  . Anxiety   . Back pain   . Constipation   . GERD (gastroesophageal reflux disease)   . Headache   . High cholesterol   . Prediabetes     PAST SURGICAL HISTORY: Past Surgical History:  Procedure Laterality Date  . HYSTEROSCOPY WITH NOVASURE N/A 01/05/2016   Procedure: HYSTEROSCOPY WITH NOVASURE;  Surgeon: Elenora Fender Ward, MD;  Location: [facility] ORS;  Service: Gynecology;  Laterality: N/A;  . LAPAROSCOPIC TUBAL LIGATION Bilateral 01/05/2016   Procedure: bilateral laparoscopic salpingectomy, endometrial abalation;  Surgeon: Elenora Fender Ward, MD;  Location: [facility] ORS;  Service: Gynecology;  Laterality: Bilateral;  . TONSILLECTOMY      SOCIAL HISTORY: Social History  Substance Use Topics  . Smoking status: Never Smoker  . Smokeless tobacco: Never Used  . Alcohol use No    FAMILY HISTORY: Family History  Problem Relation Age of Onset  . Cancer Mother 61  breast CA, now with metastatic cancer  . Depression Mother   . Anxiety disorder Mother   . Heart disease Father   . Hyperlipidemia Father   . Hypertension Father   . Sudden death Father   . Hypertension Maternal Grandmother   . Kidney disease Maternal Grandmother     ROS: Review of Systems  Constitutional: Positive for weight loss.  Gastrointestinal: Negative for nausea and vomiting.  Musculoskeletal:       Negative muscle weakness    PHYSICAL EXAM: Blood pressure 122/88, pulse (!) 121, temperature 98.4 F (36.9 C), temperature source Oral, height 5\' 3"  (1.6 m), weight 170 lb (77.1 kg), SpO2 98 %. Body mass index is 30.11 kg/m. Physical Exam  Constitutional: She is  oriented to person, place, and time. She appears well-developed and well-nourished.  Cardiovascular: Normal rate.   Pulmonary/Chest: Effort normal.  Musculoskeletal: Normal range of motion.  Neurological: She is oriented to person, place, and time.  Skin: Skin is warm and dry.  Psychiatric: She has a normal mood and affect. Her behavior is normal.  Vitals reviewed.   RECENT LABS AND TESTS: BMET    Component Value Date/Time   NA 140 01/02/2017 0946   K 4.3 01/02/2017 0946   CL 99 01/02/2017 0946   CO2 25 01/02/2017 0946   GLUCOSE 106 (H) 01/02/2017 0946   GLUCOSE 123 (H) 04/28/2016 2122   BUN 8 01/02/2017 0946   CREATININE 0.78 01/02/2017 0946   CALCIUM 9.2 01/02/2017 0946   GFRNONAA 100 01/02/2017 0946   GFRAA 116 01/02/2017 0946   Lab Results  Component Value Date   HGBA1C 5.6 01/02/2017   HGBA1C 5.6 04/30/2016   HGBA1C 5.4 06/09/2015   Lab Results  Component Value Date   INSULIN 15.2 01/02/2017   CBC    Component Value Date/Time   WBC 7.5 01/02/2017 0946   WBC 10.5 04/28/2016 2122   RBC 4.75 01/02/2017 0946   RBC 4.61 04/28/2016 2122   HGB 13.4 04/28/2016 2122   HCT 40.1 01/02/2017 0946   PLT 258 04/28/2016 2122   MCV 84 01/02/2017 0946   MCH 28.4 01/02/2017 0946   MCH 29.2 04/28/2016 2122   MCHC 33.7 01/02/2017 0946   MCHC 33.7 04/28/2016 2122   RDW 12.5 01/02/2017 0946   LYMPHSABS 2.2 01/02/2017 0946   MONOABS 0.6 04/11/2015 1511   EOSABS 0.1 01/02/2017 0946   BASOSABS 0.0 01/02/2017 0946   Iron/TIBC/Ferritin/ %Sat No results found for: IRON, TIBC, FERRITIN, IRONPCTSAT Lipid Panel     Component Value Date/Time   CHOL 114 01/02/2017 0946   TRIG 52 01/02/2017 0946   HDL 39 (L) 01/02/2017 0946   CHOLHDL 4 04/30/2016 1216   VLDL 17.4 04/30/2016 1216   LDLCALC 65 01/02/2017 0946   Hepatic Function Panel     Component Value Date/Time   PROT 7.5 01/02/2017 0946   ALBUMIN 4.6 01/02/2017 0946   AST 13 01/02/2017 0946   ALT 13 01/02/2017 0946    ALKPHOS 93 01/02/2017 0946   BILITOT 0.6 01/02/2017 0946      Component Value Date/Time   TSH 0.698 01/02/2017 0946   TSH 0.33 (L) 02/24/2014 1217    ASSESSMENT AND PLAN: Class 1 obesity without serious comorbidity with body mass index (BMI) of 30.0 to 30.9 in adult, unspecified obesity type  Vitamin D deficiency - Plan: Vitamin D, Ergocalciferol, (DRISDOL) 50000 units CAPS capsule  PLAN:  Vitamin D Deficiency Felicia Mcdowell was informed that low vitamin D levels contributes to fatigue  and are associated with obesity, breast, and colon cancer. She agrees to continue to take prescription Vit D @50 ,000 IU every week, we will refill for 1 month and we will re-check labs in 6 weeks and will follow up for routine testing of vitamin D, at least 2-3 times per year. She was informed of the risk of over-replacement of vitamin D and agrees to not increase her dose unless he discusses this with us first. She agrees to follow up with our clinic in 2 weeks.  Obesity Felicia Mcdowell is currently in the action stage of change. As such, her goal is to continue with weight loss efforts She has agreed to follow the Category 2 plan Felicia Mcdowell has been instructed to work up to a goal of 150 minutes of combined cardio and strengthening exercise per week for weight loss and overall health benefits. We discussed the following Behavioral Modification Stratagies today: better snacks, increasing lean protein intake, increasing lower sugar fruits and work on meal planning and easy cooking plans  Felicia Mcdowell has agreed to follow up with our clinic in 2 weeks. She was informed of the importance of frequent follow up visits to maximize her success with intensive lifestyle modifications for her multiple health conditions.  I, [place] CraneJoanne Murray, am acting as scribe for Quillian Quincearen Beasley, MD  I have reviewed the above documentation for accuracy and completeness, and I agree with the above. -Quillian Quincearen Beasley, MD

## 2017-02-27 ENCOUNTER — Ambulatory Visit (INDEPENDENT_AMBULATORY_CARE_PROVIDER_SITE_OTHER): Payer: Self-pay | Admitting: Family Medicine

## 2017-03-11 ENCOUNTER — Ambulatory Visit (INDEPENDENT_AMBULATORY_CARE_PROVIDER_SITE_OTHER): Payer: Self-pay | Admitting: Family Medicine

## 2017-03-11 ENCOUNTER — Encounter (INDEPENDENT_AMBULATORY_CARE_PROVIDER_SITE_OTHER): Payer: Self-pay | Admitting: Family Medicine

## 2017-03-11 NOTE — Telephone Encounter (Signed)
Thanks

## 2017-03-13 ENCOUNTER — Ambulatory Visit (INDEPENDENT_AMBULATORY_CARE_PROVIDER_SITE_OTHER): Payer: Self-pay | Admitting: Family Medicine

## 2017-07-12 ENCOUNTER — Other Ambulatory Visit: Payer: Self-pay | Admitting: Family Medicine

## 2017-08-23 ENCOUNTER — Ambulatory Visit (INDEPENDENT_AMBULATORY_CARE_PROVIDER_SITE_OTHER): Payer: 59 | Admitting: Family Medicine

## 2017-08-23 ENCOUNTER — Encounter: Payer: Self-pay | Admitting: Family Medicine

## 2017-08-23 VITALS — BP 126/80 | HR 104 | Temp 99.0°F | Wt 199.5 lb

## 2017-08-23 DIAGNOSIS — E669 Obesity, unspecified: Secondary | ICD-10-CM

## 2017-08-23 DIAGNOSIS — Z7689 Persons encountering health services in other specified circumstances: Secondary | ICD-10-CM | POA: Diagnosis not present

## 2017-08-23 DIAGNOSIS — E785 Hyperlipidemia, unspecified: Secondary | ICD-10-CM | POA: Diagnosis not present

## 2017-08-23 DIAGNOSIS — F3289 Other specified depressive episodes: Secondary | ICD-10-CM

## 2017-08-23 NOTE — Progress Notes (Signed)
Subjective:    Patient ID: Felicia Mcdowell, female    DOB: 09/01/1981, 36 y.o.   MRN: [id number]  HPI This is a 36 yo female who presents today to establish care. 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 She is married with two daughters. She enjoys exercising, reading. She and her family are currently living with her in laws, "I hate them and they hate me," while she and her husband are working on a property to flip. They have property and plan to build a home which will take about 9 months. Her husband travels during the week. Her daughters are involved in competitive cheerleading which is expensive and time consuming. Felicia Mcdowell reports that her stress level is very manageable right now. She is exercising regularly and sleeping well. She is unhappy with her weight, "I'm the heaviest I have ever been." She was seen at the [facility] for 2 months earlier this year. She lost 15 pounds. She stopped going due to scheduling conflicts. She "knows what to do."   Depression well controlled on bupropion and fluoxetine. She takes simvastatin for CAD prevention due to family history of early CAD in her father.    Sees GYN, Dr. Elesa Massed.   Past Medical History:  Diagnosis Date  . Anxiety   . Back pain   . Constipation   . GERD (gastroesophageal reflux disease)   . Headache   . High cholesterol   . Prediabetes    Past Surgical History:  Procedure Laterality Date  . HYSTEROSCOPY WITH NOVASURE N/A 01/05/2016   Procedure: HYSTEROSCOPY WITH NOVASURE;  Surgeon: Elenora Fender Ward, MD;  Location: [facility];  Service: Gynecology;  Laterality: N/A;  . LAPAROSCOPIC TUBAL LIGATION Bilateral 01/05/2016   Procedure: bilateral laparoscopic salpingectomy, endometrial abalation;  Surgeon: Elenora Fender Ward, MD;  Location: [facility];  Service: Gynecology;  Laterality: Bilateral;  . TONSILLECTOMY     Family History  Problem Relation Age of Onset  . Cancer Mother 25       breast CA, now with  metastatic cancer  . Depression Mother   . Anxiety disorder Mother   . Heart disease Father   . Hyperlipidemia Father   . Hypertension Father   . Sudden death Father   . Hypertension Maternal Grandmother   . Kidney disease Maternal Grandmother    Social History  Substance Use Topics  . Smoking status: Never Smoker  . Smokeless tobacco: Never Used  . Alcohol use No      Review of Systems Per HPI    Objective:   Physical Exam Physical Exam  Vitals reviewed. Constitutional: Oriented to person, place, and time. Appears well-developed and well-nourished.  HENT:  Head: Normocephalic and atraumatic.  Eyes: Conjunctivae are normal.  Neck: Normal range of motion. Neck supple.  Cardiovascular: Normal rate.   Pulmonary/Chest: Effort normal.  Musculoskeletal: Normal range of motion.  Neurological: Alert and oriented to person, place, and time.  Psychiatric: Normal mood and affect. Behavior is normal. Judgment and thought content normal.   BP 126/80   Pulse (!) 104   Temp 99 F (37.2 C) (Oral)   Wt 199 lb 8 oz (90.5 kg)   SpO2 99%   BMI 35.34 kg/m  Wt Readings from Last 3 Encounters:  08/23/17 199 lb 8 oz (90.5 kg)  02/12/17 170 lb (77.1 kg)  01/29/17 173 lb (78.5 kg)   Reviewed recent labs    Assessment & Plan:  1. Obesity (BMI  35.0-39.9 without comorbidity) - encouraged meal planning, stress management, pursing hobbies and fun activities  2. Other depression - well controlled on current meds  3. Hyperlipidemia, unspecified hyperlipidemia type - well controlled on simvastatin - follow up in 6 months  4. Encounter to establish care  [name], FNP-BC  [facility], [facility]  08/26/2017 8:58 PM

## 2017-08-26 ENCOUNTER — Encounter: Payer: Self-pay | Admitting: Family Medicine

## 2017-10-25 ENCOUNTER — Ambulatory Visit: Payer: 59 | Admitting: Family Medicine

## 2017-11-26 ENCOUNTER — Other Ambulatory Visit: Payer: Self-pay | Admitting: Family Medicine

## 2017-11-26 MED ORDER — FLUOXETINE HCL 40 MG PO CAPS
40.0000 mg | ORAL_CAPSULE | Freq: Every day | ORAL | 1 refills | Status: DC
Start: 2017-11-26 — End: 2018-01-14

## 2017-11-26 NOTE — Progress Notes (Signed)
Increased fluoxetine to 40 mg as requested by patient. Follow up in 4 weeks.

## 2018-01-14 ENCOUNTER — Telehealth: Payer: Self-pay | Admitting: *Deleted

## 2018-01-14 ENCOUNTER — Other Ambulatory Visit: Payer: Self-pay | Admitting: Family Medicine

## 2018-01-14 MED ORDER — FLUOXETINE HCL 20 MG PO TABS: 20.0000 mg | ORAL_TABLET | Freq: Every day | ORAL | 1 refills | Status: DC

## 2018-01-14 NOTE — Telephone Encounter (Signed)
Spoke to pt who is requesting a refill of her prozac. She is wanting to reduce back to 20mg  as she states 40mg  "is too strong." [facility]

## 2018-01-15 ENCOUNTER — Encounter: Payer: Self-pay | Admitting: Obstetrics and Gynecology

## 2018-01-15 ENCOUNTER — Ambulatory Visit (INDEPENDENT_AMBULATORY_CARE_PROVIDER_SITE_OTHER): Payer: 59 | Admitting: Obstetrics and Gynecology

## 2018-01-15 VITALS — BP 116/82 | HR 99 | Ht 63.0 in | Wt 200.0 lb

## 2018-01-15 DIAGNOSIS — Z1239 Encounter for other screening for malignant neoplasm of breast: Secondary | ICD-10-CM

## 2018-01-15 DIAGNOSIS — N898 Other specified noninflammatory disorders of vagina: Secondary | ICD-10-CM | POA: Diagnosis not present

## 2018-01-15 DIAGNOSIS — Z1231 Encounter for screening mammogram for malignant neoplasm of breast: Secondary | ICD-10-CM | POA: Diagnosis not present

## 2018-01-15 DIAGNOSIS — N816 Rectocele: Secondary | ICD-10-CM | POA: Insufficient documentation

## 2018-01-15 DIAGNOSIS — Z803 Family history of malignant neoplasm of breast: Secondary | ICD-10-CM | POA: Insufficient documentation

## 2018-01-15 DIAGNOSIS — Z01419 Encounter for gynecological examination (general) (routine) without abnormal findings: Secondary | ICD-10-CM

## 2018-01-15 NOTE — Patient Instructions (Signed)
I value your feedback and entrusting us with your care. If you get a [facility] patient survey, I would appreciate you taking the time to let us know about your experience today. Thank you! 

## 2018-01-15 NOTE — Progress Notes (Signed)
PCP:  Dianne Dun, MD   Chief Complaint  Patient presents with  . Gynecologic Exam     HPI:      [name] is a 37 y.o. [id number] who LMP was No LMP recorded. Patient has had an ablation., presents today for her annual examination.  Her menses are absent with endometrial ablation 2017 with Dr. Elesa Massed. Dysmenorrhea none. She does not have intermenstrual bleeding.  Sex activity: single partner, contraception - tubal ligation.  Last Pap: December 16, 2015  Results were: no abnormalities /neg HPV DNA  Hx of STDs: HPV on pap with colpo years ago, no recent abn paps. 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 Elesa Massed who states it could be removed. It is not painful to pt but cosmetic.  She also has a significant rectocele. Pt has to do splinting with BM. She has not tried pelvic PT, unsure about surgery. No fecal incont.  Last mammogram: December 16, 2015  Results were: normal--routine follow-up in 12 months There is a FH of breast cancer in her mom twice .Genetic testing not done. There is no FH of ovarian cancer. The patient does do self-breast exams.  Tobacco use: The patient denies current or previous tobacco use. Alcohol use: none No drug use.  Exercise: not active  She does get adequate calcium but not Vitamin D in her diet.  Labs with PCP   Past Medical History:  Diagnosis Date  . Anxiety   . Back pain   . Constipation   . GERD (gastroesophageal reflux disease)   . Headache   . High cholesterol   . Prediabetes     Past Surgical History:  Procedure Laterality Date  . HYSTEROSCOPY WITH NOVASURE N/A 01/05/2016   Procedure: HYSTEROSCOPY WITH NOVASURE;  Surgeon: Elenora Fender Ward, MD;  Location: [facility];  Service: Gynecology;  Laterality: N/A;  . LAPAROSCOPIC TUBAL LIGATION Bilateral 01/05/2016   Procedure: bilateral laparoscopic salpingectomy, endometrial abalation;  Surgeon: Elenora Fender Ward, MD;  Location: [facility];  Service: Gynecology;  Laterality: Bilateral;  .  TONSILLECTOMY      Family History  Problem Relation Age of Onset  . Cancer Mother 35       breast CA, now with metastatic cancer  . Depression Mother   . Anxiety disorder Mother   . Heart disease Father   . Hyperlipidemia Father   . Hypertension Father   . Sudden death Father   . Hypertension Maternal Grandmother   . Kidney disease Maternal Grandmother     Social History   Socioeconomic History  . Marital status: Married    Spouse name: jeff Busbee  . Number of children: 2  . Years of education: Not on file  . Highest education level: Not on file  Social Needs  . Financial resource strain: Not on file  . Food insecurity - worry: Not on file  . Food insecurity - inability: Not on file  . Transportation needs - medical: Not on file  . Transportation needs - non-medical: Not on file  Occupational History  . Occupation: Passenger transport manager: [employer]  Tobacco Use  . Smoking status: Never Smoker  . Smokeless tobacco: Never Used  Substance and Sexual Activity  . Alcohol use: Yes    Comment: 1-2 glasses wine on weekend nights  . Drug use: No  . Sexual activity: Yes    Partners: Male    Birth control/protection: Surgical  Other Topics Concern  .  Not on file  Social History Narrative   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[employer]   Married, two daughters   Enjoys exercising, reading          Current Meds  Medication Sig  . FLUoxetine (PROZAC) 20 MG tablet Take 1 tablet (20 mg total) by mouth daily.  . simvastatin (ZOCOR) 20 MG tablet TAKE ONE TABLET BY MOUTH EVERY NIGHT AT BEDTIME     ROS:  Review of Systems  Constitutional: Negative for fatigue, fever and unexpected weight change.  Respiratory: Negative for cough, shortness of breath and wheezing.   Cardiovascular: Negative for chest pain, palpitations and leg swelling.  Gastrointestinal: Negative for blood in stool, constipation, diarrhea, nausea and vomiting.  Endocrine: Negative for cold  intolerance, heat intolerance and polyuria.  Genitourinary: Negative for dyspareunia, dysuria, flank pain, frequency, genital sores, hematuria, menstrual problem, pelvic pain, urgency, vaginal bleeding, vaginal discharge and vaginal pain.  Musculoskeletal: Negative for back pain, joint swelling and myalgias.  Skin: Negative for rash.  Neurological: Negative for dizziness, syncope, light-headedness, numbness and headaches.  Hematological: Negative for adenopathy.  Psychiatric/Behavioral: Negative for agitation, confusion, sleep disturbance and suicidal ideas. The patient is not nervous/anxious.      Objective: BP 116/82   Pulse 99   Ht 5\' 3"  (1.6 m)   Wt 200 lb (90.7 kg)   BMI 35.43 kg/m    Physical Exam  Constitutional: She is oriented to person, place, and time. She appears well-developed and well-nourished.  Genitourinary: Vagina normal and uterus normal. There is no rash or tenderness on the right labia. There is no rash or tenderness on the left labia. No erythema or tenderness in the vagina. No vaginal discharge found. Right adnexum does not display mass and does not display tenderness. Left adnexum does not display mass and does not display tenderness. Cervix does not exhibit motion tenderness or polyp. Uterus is not enlarged or tender.  Genitourinary Comments: STAGE 2 RECTOCELE WITH VALSALVA  Neck: Normal range of motion. No thyromegaly present.  Cardiovascular: Normal rate, regular rhythm and normal heart sounds.  No murmur heard. Pulmonary/Chest: Effort normal and breath sounds normal. Right breast exhibits no mass, no nipple discharge, no skin change and no tenderness. Left breast exhibits no mass, no nipple discharge, no skin change and no tenderness.  Abdominal: Soft. There is no tenderness. There is no guarding.  Musculoskeletal: Normal range of motion.  Neurological: She is alert and oriented to person, place, and time. No cranial nerve deficit.  Psychiatric: She has a  normal mood and affect. Her behavior is normal.  Vitals reviewed.   Assessment/Plan: Encounter for annual routine gynecological examination  Screening for breast cancer -  Pt to sched mammo. - Plan: MM SCREENING BREAST TOMO BILATERAL  Family history of breast cancer - MyRisk testing discussed and handout given. Pt to f/u if desires. In the meantime, suggest monthly SBE, yearly CBE and mammos. - Plan: MM SCREENING BREAST TOMO BILATERAL  Rectocele - Discussed PT, pt to f/u if desires. Can also refer to MD for surg consultation.  Hymenal remnant - Discussed pros/cons of surg repair. Pt to consider.    GYN counsel breast self exam, mammography screening, adequate intake of calcium and vitamin D, diet and exercise     F/U  Return in about 1 year (around 01/15/2019).  Chevis Weisensel B. Sherria Riemann, PA-C 01/15/2018 11:18 AM

## 2018-02-19 ENCOUNTER — Other Ambulatory Visit: Payer: Self-pay | Admitting: *Deleted

## 2018-02-19 MED ORDER — SIMVASTATIN 20 MG PO TABS: 20.0000 mg | ORAL_TABLET | Freq: Every day | ORAL | 3 refills | Status: DC

## 2018-02-19 MED ORDER — FLUOXETINE HCL 20 MG PO TABS: 20.0000 mg | ORAL_TABLET | Freq: Every day | ORAL | 3 refills | Status: DC

## 2018-02-19 NOTE — Telephone Encounter (Signed)
Pt has changed pharmacies and is requesting new Rx. Sent as requested

## 2018-06-23 ENCOUNTER — Ambulatory Visit: Payer: 59 | Admitting: Family Medicine

## 2018-06-23 ENCOUNTER — Encounter: Payer: Self-pay | Admitting: Family Medicine

## 2018-06-23 DIAGNOSIS — R300 Dysuria: Secondary | ICD-10-CM | POA: Diagnosis not present

## 2018-06-23 DIAGNOSIS — N3090 Cystitis, unspecified without hematuria: Secondary | ICD-10-CM

## 2018-06-23 LAB — POC URINALSYSI DIPSTICK (AUTOMATED)
Bilirubin, UA: NEGATIVE
Glucose, UA: NEGATIVE
Ketones, UA: NEGATIVE
Nitrite, UA: NEGATIVE
Protein, UA: POSITIVE — AB
Spec Grav, UA: >=1.03 — AB (ref 1.010–1.025)
Urobilinogen, UA: 0.2 E.U./dL
pH, UA: 6 (ref 5.0–8.0)

## 2018-06-23 MED ORDER — SULFAMETHOXAZOLE-TRIMETHOPRIM 800-160 MG PO TABS: 1.0000 | ORAL_TABLET | Freq: Two times a day (BID) | ORAL | 0 refills | Status: DC

## 2018-06-23 NOTE — Progress Notes (Signed)
Subjective:    Patient ID: Felicia Mcdowell, female    DOB: 02/20/1981, 37 y.o.   MRN: [id number]  HPI This is a 37 yo female who presents today with dysuria, urinary frequency and urgency x 2 days. She denies fever/chills, abdominal pain, nausea/vomiting, flank pain or vaginal discharge. No recent antibiotic use.  Past Medical History:  Diagnosis Date  . Anxiety   . Back pain   . Constipation   . GERD (gastroesophageal reflux disease)   . Headache   . High cholesterol   . Prediabetes    Past Surgical History:  Procedure Laterality Date  . HYSTEROSCOPY WITH NOVASURE N/A 01/05/2016   Procedure: HYSTEROSCOPY WITH NOVASURE;  Surgeon: Elenora Fender Ward, MD;  Location: [facility];  Service: Gynecology;  Laterality: N/A;  . LAPAROSCOPIC TUBAL LIGATION Bilateral 01/05/2016   Procedure: bilateral laparoscopic salpingectomy, endometrial abalation;  Surgeon: Elenora Fender Ward, MD;  Location: [facility];  Service: Gynecology;  Laterality: Bilateral;  . TONSILLECTOMY     Family History  Problem Relation Age of Onset  . Cancer Mother 35       breast CA, now with metastatic cancer  . Depression Mother   . Anxiety disorder Mother   . Heart disease Father   . Hyperlipidemia Father   . Hypertension Father   . Sudden death Father   . Hypertension Maternal Grandmother   . Kidney disease Maternal Grandmother    Social History   Tobacco Use  . Smoking status: Never Smoker  . Smokeless tobacco: Never Used  Substance Use Topics  . Alcohol use: Yes    Comment: 1-2 glasses wine on weekend nights  . Drug use: No      Review of Systems Per HPI    Objective:   Physical Exam Physical Exam  Vitals reviewed. Constitutional: Oriented to person, place, and time. Appears well-developed and well-nourished.  HENT:  Head: Normocephalic and atraumatic.  Eyes: Conjunctivae are normal.  Neck: Normal range of motion. Neck supple.    Pulmonary/Chest: Effort normal.  Musculoskeletal: Normal range of motion.    Neurological: Alert and oriented to person, place, and time.  Skin: Skin is warm and dry.  Psychiatric: Normal mood and affect. Behavior is normal. Judgment and thought content normal.      BP Readings from Last 3 Encounters:  01/15/18 116/82  08/23/17 126/80  02/12/17 122/88   Wt Readings from Last 3 Encounters:  01/15/18 200 lb (90.7 kg)  08/23/17 199 lb 8 oz (90.5 kg)  02/12/17 170 lb (77.1 kg)   Results for orders placed or performed in visit on 06/23/18  POCT Urinalysis Dipstick (Automated)  Result Value Ref Range   Color, UA     Clarity, UA     Glucose, UA Negative Negative   Bilirubin, UA neg    Ketones, UA neg    Spec Grav, UA >=1.030 (A) 1.010 - 1.025   Blood, UA 2+    pH, UA 6.0 5.0 - 8.0   Protein, UA Positive (A) Negative   Urobilinogen, UA 0.2 0.2 or 1.0 E.U./dL   Nitrite, UA neg    Leukocytes, UA Small (1+) (A) Negative       Assessment & Plan:  1. Dysuria - POCT Urinalysis Dipstick (Automated)  2. Cystitis -Provided written and verbal information regarding diagnosis and treatment. - Urine Culture - sulfamethoxazole-trimethoprim (BACTRIM DS,SEPTRA DS) 800-160 MG tablet; Take 1 tablet by mouth 2 (two) times daily.  Dispense: 10 tablet; Refill: 0   [name],  FNP-BC  [facility]  06/23/2018 8:19 AM

## 2018-06-23 NOTE — Patient Instructions (Signed)

## 2018-06-24 LAB — URINE CULTURE
MICRO NUMBER:: 90922892
SPECIMEN QUALITY: ADEQUATE

## 2018-06-26 ENCOUNTER — Ambulatory Visit (INDEPENDENT_AMBULATORY_CARE_PROVIDER_SITE_OTHER)
Admission: RE | Admit: 2018-06-26 | Discharge: 2018-06-26 | Disposition: A | Discharge status: Home / Self Care | Payer: 59 | Source: Ambulatory Visit | Attending: Family Medicine | Admitting: Family Medicine

## 2018-06-26 ENCOUNTER — Encounter: Payer: Self-pay | Admitting: Family Medicine

## 2018-06-26 ENCOUNTER — Ambulatory Visit (INDEPENDENT_AMBULATORY_CARE_PROVIDER_SITE_OTHER): Payer: 59 | Admitting: Family Medicine

## 2018-06-26 DIAGNOSIS — R059 Cough, unspecified: Secondary | ICD-10-CM | POA: Insufficient documentation

## 2018-06-26 DIAGNOSIS — R05 Cough: Secondary | ICD-10-CM | POA: Diagnosis not present

## 2018-06-26 MED ORDER — PREDNISONE 20 MG PO TABS: ORAL_TABLET | ORAL | 0 refills | Status: DC

## 2018-06-26 MED ORDER — PROMETHAZINE-DM 6.25-15 MG/5ML PO SYRP: 5.0000 mL | ORAL_SOLUTION | Freq: Four times a day (QID) | ORAL | 0 refills | Status: DC | PRN

## 2018-06-26 MED ORDER — BENZONATATE 200 MG PO CAPS: 200.0000 mg | ORAL_CAPSULE | Freq: Three times a day (TID) | ORAL | 1 refills | Status: DC | PRN

## 2018-06-26 NOTE — Assessment & Plan Note (Signed)
Suspect cough is multi factorial (allergies/post viral/smoking/gerd) and cyclic (perpetuating itself)  Adv to stay away from cig smoke  40 mg prednisone taper Tessalon tid  Promethazine- at night and as needed  Continue ppi and add zantac 150 mg bid  Allergy meds as needed cxr today  Update if not starting to improve in a week or if worsening

## 2018-06-26 NOTE — Patient Instructions (Signed)
Prednisone 40 mg taper Tessalon tid prometh DM prn  Zantac 150 mg bid  Continue prilosec   cxr Update

## 2018-06-26 NOTE — Progress Notes (Signed)
Subjective:    Patient ID: Felicia Mcdowell, female    DOB: 05/04/1981, 37 y.o.   MRN: [id number]  HPI Here with ongoing cough and sob    3 straight months  Thought it was allergies  pnd and cough to begin with - xyzal, zyrtec, flonase - did not help  Then prednisone burst 5 days - helped a lot and came right back   Delsym here and there  After stopping prednisone she got short of breath  Then tried prilosec - this is helping a bit - but only for 1/2 day   Sob- feels she cannot get a good deep breath  Feels full in the throat more than lungs No swallowing problems   Wheezing- feels when she is sob and cough is bad   Phlegm is all clear   Smoking - 1 pack of cig in 10 y - some lately  Most in the past year   No tic bites or change in headaches   Hx of gastritis on and off -not bothering her  No heartburn      Wt Readings from Last 3 Encounters:  01/15/18 200 lb (90.7 kg)  08/23/17 199 lb 8 oz (90.5 kg)  02/12/17 170 lb (77.1 kg)  pulse ox 98% Temp: 98.1 F (36.7 C)   Patient Active Problem List   Diagnosis Date Noted  . Cough 06/26/2018  . Rectocele 01/15/2018  . Family history of breast cancer 01/15/2018  . Class 1 obesity without serious comorbidity with body mass index (BMI) of 30.0 to 30.9 in adult 02/12/2017  . Vitamin D deficiency 02/12/2017  . Abdominal pain, epigastric 04/30/2016  . Elevated hemoglobin A1c 06/09/2015  . HLD (hyperlipidemia) 03/29/2014  . Family history of early CAD 02/19/2013  . Anxiety and depression    Past Medical History:  Diagnosis Date  . Anxiety   . Back pain   . Constipation   . GERD (gastroesophageal reflux disease)   . Headache   . High cholesterol   . Prediabetes    Past Surgical History:  Procedure Laterality Date  . HYSTEROSCOPY WITH NOVASURE N/A 01/05/2016   Procedure: HYSTEROSCOPY WITH NOVASURE;  Surgeon: Elenora Fenderhelsea C Ward, MD;  Location: [facility] ORS;  Service: Gynecology;  Laterality: N/A;  . LAPAROSCOPIC TUBAL  LIGATION Bilateral 01/05/2016   Procedure: bilateral laparoscopic salpingectomy, endometrial abalation;  Surgeon: Elenora Fenderhelsea C Ward, MD;  Location: [facility] ORS;  Service: Gynecology;  Laterality: Bilateral;  . TONSILLECTOMY     Social History   Tobacco Use  . Smoking status: Never Smoker  . Smokeless tobacco: Never Used  Substance Use Topics  . Alcohol use: Yes    Comment: 1-2 glasses wine on weekend nights  . Drug use: No   Family History  Problem Relation Age of Onset  . Cancer Mother 3440       breast CA, now with metastatic cancer  . Depression Mother   . Anxiety disorder Mother   . Heart disease Father   . Hyperlipidemia Father   . Hypertension Father   . Sudden death Father   . Hypertension Maternal Grandmother   . Kidney disease Maternal Grandmother    Allergies  Allergen Reactions  . Augmentin [Amoxicillin-Pot Clavulanate] Hives and Rash    Rash    Current Outpatient Medications on File Prior to Visit  Medication Sig Dispense Refill  . buPROPion (WELLBUTRIN SR) 150 MG 12 hr tablet Take 1 tablet (150 mg total) by mouth 2 (two) times daily. (Patient not  taking: Reported on 01/15/2018) 60 tablet 3  . FLUoxetine (PROZAC) 20 MG tablet Take 1 tablet (20 mg total) by mouth daily. (Patient not taking: Reported on 06/26/2018) 90 tablet 3  . simvastatin (ZOCOR) 20 MG tablet Take 1 tablet (20 mg total) by mouth at bedtime. (Patient not taking: Reported on 06/26/2018) 90 tablet 3   No current facility-administered medications on file prior to visit.       Review of Systems  Constitutional: Negative for activity change, appetite change, fatigue, fever and unexpected weight change.  HENT: Negative for congestion, ear pain, rhinorrhea, sinus pressure and sore throat.   Eyes: Negative for pain, redness and visual disturbance.  Respiratory: Positive for cough, shortness of breath and wheezing.   Cardiovascular: Negative for chest pain and palpitations.  Gastrointestinal: Negative for  abdominal pain, blood in stool, constipation and diarrhea.  Endocrine: Negative for polydipsia and polyuria.  Genitourinary: Negative for dysuria, frequency and urgency.  Musculoskeletal: Negative for arthralgias, back pain and myalgias.  Skin: Negative for pallor and rash.  Allergic/Immunologic: Negative for environmental allergies.  Neurological: Negative for dizziness, syncope and headaches.  Hematological: Negative for adenopathy. Does not bruise/bleed easily.  Psychiatric/Behavioral: Negative for decreased concentration and dysphoric mood. The patient is not nervous/anxious.        Objective:   Physical Exam  Constitutional: She is oriented to person, place, and time. She appears well-developed and well-nourished. No distress.  HENT:  Head: Normocephalic and atraumatic.  Right Ear: External ear normal.  Left Ear: External ear normal.  Mouth/Throat: Oropharynx is clear and moist.  Nares are boggy  Eyes: Pupils are equal, round, and reactive to light. Conjunctivae and EOM are normal. Right eye exhibits no discharge. Left eye exhibits no discharge. No scleral icterus.  Neck: Normal range of motion. Neck supple. No JVD present. No tracheal deviation present. No thyromegaly present.  Cardiovascular: Normal rate, regular rhythm and normal heart sounds.  Pulmonary/Chest: Effort normal and breath sounds normal. No stridor. No respiratory distress. She has no wheezes. She has no rales.  slt upper airway wheeze on forced exp only  bs are harsh No rales or rhonchi  Good air exch   Abdominal: Soft. Bowel sounds are normal. She exhibits no distension and no mass. There is no tenderness. There is no guarding.  Lymphadenopathy:    She has no cervical adenopathy.  Neurological: She is alert and oriented to person, place, and time. No cranial nerve deficit.  Skin: Skin is warm and dry. No rash noted.  Psychiatric: She has a normal mood and affect.          Assessment & Plan:   Problem  List Items Addressed This Visit      Other   Cough    Suspect cough is multi factorial (allergies/post viral/smoking/gerd) and cyclic (perpetuating itself)  Adv to stay away from cig smoke  40 mg prednisone taper Tessalon tid  Promethazine- at night and as needed  Continue ppi and add zantac 150 mg bid  Allergy meds as needed cxr today  Update if not starting to improve in a week or if worsening        Relevant Orders   DG Chest 2 View

## 2018-07-25 ENCOUNTER — Ambulatory Visit
Admission: RE | Admit: 2018-07-25 | Discharge: 2018-07-25 | Disposition: A | Discharge status: Home / Self Care | Payer: 59 | Source: Ambulatory Visit | Attending: Obstetrics and Gynecology | Admitting: Obstetrics and Gynecology

## 2018-07-25 DIAGNOSIS — Z1231 Encounter for screening mammogram for malignant neoplasm of breast: Secondary | ICD-10-CM | POA: Diagnosis not present

## 2018-07-25 DIAGNOSIS — Z803 Family history of malignant neoplasm of breast: Secondary | ICD-10-CM | POA: Diagnosis not present

## 2018-07-25 DIAGNOSIS — Z1239 Encounter for other screening for malignant neoplasm of breast: Secondary | ICD-10-CM

## 2018-07-28 ENCOUNTER — Other Ambulatory Visit: Payer: Self-pay | Admitting: Obstetrics and Gynecology

## 2018-07-28 ENCOUNTER — Other Ambulatory Visit: Payer: Self-pay | Admitting: Internal Medicine

## 2018-07-28 DIAGNOSIS — R928 Other abnormal and inconclusive findings on diagnostic imaging of breast: Secondary | ICD-10-CM

## 2018-07-28 DIAGNOSIS — N6320 Unspecified lump in the left breast, unspecified quadrant: Secondary | ICD-10-CM

## 2018-08-01 ENCOUNTER — Ambulatory Visit
Admission: RE | Admit: 2018-08-01 | Discharge: 2018-08-01 | Disposition: A | Discharge status: Home / Self Care | Payer: 59 | Source: Ambulatory Visit | Attending: Obstetrics and Gynecology | Admitting: Obstetrics and Gynecology

## 2018-08-01 DIAGNOSIS — N6320 Unspecified lump in the left breast, unspecified quadrant: Secondary | ICD-10-CM | POA: Insufficient documentation

## 2018-08-01 DIAGNOSIS — N6324 Unspecified lump in the left breast, lower inner quadrant: Secondary | ICD-10-CM | POA: Diagnosis not present

## 2018-08-01 DIAGNOSIS — R928 Other abnormal and inconclusive findings on diagnostic imaging of breast: Secondary | ICD-10-CM | POA: Insufficient documentation

## 2018-08-04 ENCOUNTER — Telehealth: Payer: Self-pay | Admitting: Obstetrics and Gynecology

## 2018-08-04 DIAGNOSIS — N6320 Unspecified lump in the left breast, unspecified quadrant: Secondary | ICD-10-CM

## 2018-08-04 DIAGNOSIS — R928 Other abnormal and inconclusive findings on diagnostic imaging of breast: Secondary | ICD-10-CM

## 2018-08-04 NOTE — Telephone Encounter (Signed)
Pt aware of Cat 3 mammo LT breast, prob cyst. Repeat u/s in 6 months. Questions answered. F/u prn breast changes in meantime.

## 2018-08-27 ENCOUNTER — Ambulatory Visit (INDEPENDENT_AMBULATORY_CARE_PROVIDER_SITE_OTHER): Payer: 59 | Admitting: Family Medicine

## 2018-08-27 ENCOUNTER — Encounter: Payer: Self-pay | Admitting: Family Medicine

## 2018-08-27 VITALS — BP 127/97 | HR 89 | Temp 98.1°F | Ht 63.0 in | Wt 200.0 lb

## 2018-08-27 DIAGNOSIS — R053 Chronic cough: Secondary | ICD-10-CM

## 2018-08-27 DIAGNOSIS — R05 Cough: Secondary | ICD-10-CM | POA: Diagnosis not present

## 2018-08-27 MED ORDER — HYDROCODONE-HOMATROPINE 5-1.5 MG/5ML PO SYRP: 5.0000 mL | ORAL_SOLUTION | Freq: Three times a day (TID) | ORAL | 0 refills | Status: DC | PRN

## 2018-08-27 MED ORDER — ALBUTEROL SULFATE HFA 108 (90 BASE) MCG/ACT IN AERS: 2.0000 | INHALATION_SPRAY | RESPIRATORY_TRACT | 1 refills | Status: DC | PRN

## 2018-08-27 NOTE — Progress Notes (Signed)
Subjective:    Patient ID: Felicia Mcdowell, female    DOB: Mar 15, 1981, 37 y.o.   MRN: [id number]  HPI This is a 37 yo female who present today with cough x 5 months, worse at night and in am, some throughout day. Did not have preceding URI. Was vaping when first started but stopped after 2 months. No smoking. Negative CXR 06/26/18. Productive of white phlegm.  Has had prednisone x 2 rounds, omeprazole BID, ranitidine, cetirizine. Slight improvement with flonase and bid cetirizine,  No relief with benzonate, Delsym, mucinex. Tessalon during day, promethazine DM at night for 1 month without relief. Near total relief with prednisone.  Feels allergy like. No fever, fatigue, night sweats, sore throat. Feels tickle in throat, "inflamed" in throat then cough starts and is hard to stop. Some improvement with cough lozenges. Occasional reflux symptoms but not associated with cough. Worse with going into heat.     Past Medical History:  Diagnosis Date  . Anxiety   . Back pain   . Constipation   . GERD (gastroesophageal reflux disease)   . Headache   . High cholesterol   . Prediabetes    Past Surgical History:  Procedure Laterality Date  . HYSTEROSCOPY WITH NOVASURE N/A 01/05/2016   Procedure: HYSTEROSCOPY WITH NOVASURE;  Surgeon: Elenora Fender Ward, MD;  Location: [facility];  Service: Gynecology;  Laterality: N/A;  . LAPAROSCOPIC TUBAL LIGATION Bilateral 01/05/2016   Procedure: bilateral laparoscopic salpingectomy, endometrial abalation;  Surgeon: Elenora Fender Ward, MD;  Location: [facility] OR[facility];  Service: Gynecology;  Laterality: Bilateral;  . TONSILLECTOMY     Family History  Problem Relation Age of Onset  . Cancer Mother 43       breast CA, now with metastatic cancer  . Depression Mother   . Anxiety disorder Mother   . Breast cancer Mother 60  . Heart disease Father   . Hyperlipidemia Father   . Hypertension Father   . Sudden death Father   . Hypertension Maternal Grandmother   . Kidney disease  Maternal Grandmother    Social History   Tobacco Use  . Smoking status: Never Smoker  . Smokeless tobacco: Never Used  Substance Use Topics  . Alcohol use: Yes    Comment: 1-2 glasses wine on weekend nights  . Drug use: No       Review of Systems Per HPI    Objective:   Physical Exam  Constitutional: She is oriented to person, place, and time. She appears well-developed and well-nourished. No distress.  HENT:  Head: Normocephalic and atraumatic.  Right Ear: Tympanic membrane, external ear and ear canal normal.  Left Ear: Tympanic membrane, external ear and ear canal normal.  Nose: Septal deviation present.  Mouth/Throat: Uvula is midline, oropharynx is clear and moist and mucous membranes are normal.  Eyes: Conjunctivae are normal.  Neck: Normal range of motion. Neck supple.  Cardiovascular: Normal rate, regular rhythm and normal heart sounds.  Pulmonary/Chest: Effort normal and breath sounds normal. No stridor. No respiratory distress. She has no wheezes. She has no rales.  Neurological: She is alert and oriented to person, place, and time.  Skin: Skin is warm and dry. She is not diaphoretic.  Vitals reviewed.     BP (!) 127/97 (BP Location: Right Arm, Patient Position: Sitting, Cuff Size: Normal)   Pulse 89   Temp 98.1 F (36.7 C) (Oral)   Ht 5\' 3"  (1.6 m)   Wt 200 lb (90.7 kg)   SpO2 99%  BMI 35.43 kg/m  Wt Readings from Last 3 Encounters:  08/27/18 200 lb (90.7 kg)  01/15/18 200 lb (90.7 kg)  08/23/17 199 lb 8 oz (90.5 kg)       Assessment & Plan:  1. Chronic cough -Provided written and verbal information regarding diagnosis and treatment. - Will try to more aggressively break cough cycle- continue current antihistamine and fluticasone, add albuterol to see if helps and night time cough suppressant. Consider adding ICS if no improvement and/or pulmonary referral.  - albuterol (PROVENTIL HFA;VENTOLIN HFA) 108 (90 Base) MCG/ACT inhaler; Inhale 2 puffs  into the lungs every 4 (four) hours as needed for wheezing or shortness of breath (cough, shortness of breath or wheezing.).  Dispense: 1 Inhaler; Refill: 1 - HYDROcodone-homatropine (HYCODAN) 5-1.5 MG/5ML syrup; Take 5 mLs by mouth every 8 (eight) hours as needed for cough.  Dispense: 100 mL; Refill: 0   [name], FNP-BC  [facility], [facility]  08/29/2018 5:52 AM

## 2018-08-29 ENCOUNTER — Encounter: Payer: Self-pay | Admitting: Family Medicine

## 2018-09-04 ENCOUNTER — Other Ambulatory Visit: Payer: Self-pay | Admitting: Family Medicine

## 2018-09-04 DIAGNOSIS — J4520 Mild intermittent asthma, uncomplicated: Secondary | ICD-10-CM

## 2018-09-04 MED ORDER — FLUTICASONE PROPIONATE HFA 44 MCG/ACT IN AERO: 2.0000 | INHALATION_SPRAY | Freq: Two times a day (BID) | RESPIRATORY_TRACT | 12 refills | Status: DC

## 2018-09-05 ENCOUNTER — Ambulatory Visit: Payer: 59 | Admitting: Obstetrics and Gynecology

## 2018-09-05 ENCOUNTER — Encounter: Payer: Self-pay | Admitting: Obstetrics and Gynecology

## 2018-09-05 VITALS — BP 118/74 | Ht 64.0 in | Wt 208.0 lb

## 2018-09-05 DIAGNOSIS — N939 Abnormal uterine and vaginal bleeding, unspecified: Secondary | ICD-10-CM | POA: Diagnosis not present

## 2018-09-05 NOTE — Progress Notes (Signed)
Obstetrics & Gynecology Office Visit    Chief Complaint  Patient presents with  . Menstrual Problem   History of Present Illness: 37 y.o. [id number] female who presents due to abnormal menstrual bleeding. She had an endometrial ablation on 01/05/2016.  She has had no bleeding since that time until about 3 months ago.  She has had monthly bleeding now and what sounds like a cyclic pattern.  The first two times she noted a little pink spotting.  The last time, a few weeks ago, it was pretty heavy for 2 days.  She noted it when she wiped. She didn't have to wear a pad or tampon. She just noted it was heavy for her since her ablation. She has had no pain associated with her bleeding. Her last pap smear was 2.5 years ago and was normal.  She denies cramping and abdominal pain associated with her bleeding.  Also notably, she is status post tubal ligation.  Past Medical History:  Diagnosis Date  . Anxiety   . Back pain   . Constipation   . GERD (gastroesophageal reflux disease)   . Headache   . High cholesterol   . Prediabetes     Past Surgical History:  Procedure Laterality Date  . HYSTEROSCOPY WITH NOVASURE N/A 01/05/2016   Procedure: HYSTEROSCOPY WITH NOVASURE;  Surgeon: Elenora Fender Ward, MD;  Location: [facility] OR[facility];  Service: Gynecology;  Laterality: N/A;  . LAPAROSCOPIC TUBAL LIGATION Bilateral 01/05/2016   Procedure: bilateral laparoscopic salpingectomy, endometrial abalation;  Surgeon: Elenora Fender Ward, MD;  Location: [facility] ORS;  Service: Gynecology;  Laterality: Bilateral;  . TONSILLECTOMY     Gynecologic History: No LMP recorded. Patient has had an ablation.  Obstetric History: [id number], s/p SVD x 2  Family History  Problem Relation Age of Onset  . Cancer Mother 97       breast CA, now with metastatic cancer  . Depression Mother   . Anxiety disorder Mother   . Breast cancer Mother 49  . Heart disease Father   . Hyperlipidemia Father   . Hypertension Father   . Sudden death Father   .  Hypertension Maternal Grandmother   . Kidney disease Maternal Grandmother    Social History   Socioeconomic History  . Marital status: Married    Spouse name: jeff Gillison  . Number of children: 2  . Years of education: Not on file  . Highest education level: Not on file  Occupational History  . Occupation: Passenger transport manager: [employer]  Social Needs  . Financial resource strain: Not on file  . Food insecurity:    Worry: Not on file    Inability: Not on file  . Transportation needs:    Medical: Not on file    Non-medical: Not on file  Tobacco Use  . Smoking status: Never Smoker  . Smokeless tobacco: Never Used  Substance and Sexual Activity  . Alcohol use: Yes    Comment: 1-2 glasses wine on weekend nights  . Drug use: No  . Sexual activity: Yes    Partners: Male    Birth control/protection: Surgical  Lifestyle  . Physical activity:    Days per week: Not on file    Minutes per session: Not on file  . Stress: Not on file  Relationships  . Social connections:    Talks on phone: Not on file    Gets together: Not on file    Attends religious service: Not on file  Active member of club or organization: Not on file    Attends meetings of clubs or organizations: Not on file    Relationship status: Not on file  . Intimate partner violence:    Fear of current or ex partner: Not on file    Emotionally abused: Not on file    Physically abused: Not on file    Forced sexual activity: Not on file  Other Topics Concern  . 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 Augmentin [Amoxicillin-Pot Clavulanate] Hives and Rash    Rash     Prior to Admission medications   Medication Sig Start Date End Date Taking? Authorizing Provider  albuterol (PROVENTIL HFA;VENTOLIN HFA) 108 (90 Base) MCG/ACT inhaler Inhale 2 puffs into the  lungs every 4 (four) hours as needed for wheezing or shortness of breath (cough, shortness of breath or wheezing.). 08/27/18  Yes Emi Belfast, FNP  cetirizine (ZYRTEC) 10 MG tablet Take 10 mg by mouth 2 (two) times daily.   Yes [provider]  FLUoxetine (PROZAC) 20 MG tablet Take 1 tablet (20 mg total) by mouth daily. 02/19/18  Yes Emi Belfast, FNP  fluticasone (FLOVENT HFA) 44 MCG/ACT inhaler Inhale 2 puffs into the lungs 2 (two) times daily. 09/04/18  Yes Emi Belfast, FNP  simvastatin (ZOCOR) 20 MG tablet Take 1 tablet (20 mg total) by mouth at bedtime. 02/19/18  Yes Emi Belfast, FNP    Review of Systems  Constitutional: Negative.   HENT: Negative.   Eyes: Negative.   Respiratory: Negative.   Cardiovascular: Negative.   Gastrointestinal: Negative.   Genitourinary: Negative.   Musculoskeletal: Negative.   Skin: Negative.   Neurological: Negative.   Psychiatric/Behavioral: Negative.      Physical Exam BP 118/74   Ht 5\' 4"  (1.626 m)   Wt 208 lb (94.3 kg)   BMI 35.70 kg/m  No LMP recorded. Patient has had an ablation. Physical Exam  Constitutional: She is oriented to person, place, and time. She appears well-developed and well-nourished. No distress.  Genitourinary: Vagina normal and uterus normal. Pelvic exam was performed with patient supine. There is no rash, tenderness, lesion or injury on the right labia. There is no rash, tenderness, lesion or injury on the left labia. Right adnexum does not display mass, does not display tenderness and does not display fullness. Left adnexum does not display mass, does not display tenderness and does not display fullness. Cervix does not exhibit motion tenderness, lesion or polyp.  HENT:  Head: Normocephalic and atraumatic.  Eyes: Conjunctivae are normal. No scleral icterus.  Pulmonary/Chest: No respiratory distress.  Abdominal: Soft. Bowel sounds are normal. She exhibits no distension and no mass. There is no  tenderness. There is no rebound and no guarding.  Musculoskeletal: Normal range of motion. She exhibits no edema.  Neurological: She is alert and oriented to person, place, and time. No cranial nerve deficit.  Psychiatric: She has a normal mood and affect. Her behavior is normal. Judgment normal.   Female chaperone present for pelvic and breast  portions of the physical exam  Assessment: 37 y.o. [id number] female here for  1. Abnormal uterine bleeding      Plan: Problem List Items Addressed This Visit      Genitourinary   Abnormal uterine bleeding - Primary  Relevant Orders   US PELVIS TRANSVANGINAL NON-OB (TV ONLY)     Mostly, it appears the patient is seeking reassurance and wants to be sure that her bleeding is from no other source.  I recommended and a pelvic ultrasound.  However, she would like to wait to see the how things go for a little while longer, which I think is reasonable.  She states that as long as her bleeding does not go back to the level that it was before her ablation, she should be fine.  No findings today on exam to suggest an alternative etiology to her bleeding.  Thomasene Mohair, MD 09/05/2018 11:49 AM

## 2019-02-09 ENCOUNTER — Other Ambulatory Visit: Payer: 59

## 2019-02-11 ENCOUNTER — Other Ambulatory Visit: Payer: Self-pay

## 2019-02-11 ENCOUNTER — Ambulatory Visit
Admission: RE | Admit: 2019-02-11 | Discharge: 2019-02-11 | Disposition: A | Discharge status: Home / Self Care | Payer: No Typology Code available for payment source | Source: Ambulatory Visit | Attending: Obstetrics and Gynecology | Admitting: Obstetrics and Gynecology

## 2019-02-11 DIAGNOSIS — N6320 Unspecified lump in the left breast, unspecified quadrant: Secondary | ICD-10-CM | POA: Diagnosis present

## 2019-02-11 DIAGNOSIS — R928 Other abnormal and inconclusive findings on diagnostic imaging of breast: Secondary | ICD-10-CM | POA: Diagnosis present

## 2019-02-12 ENCOUNTER — Other Ambulatory Visit: Payer: Self-pay | Admitting: Obstetrics and Gynecology

## 2019-02-12 DIAGNOSIS — N6320 Unspecified lump in the left breast, unspecified quadrant: Secondary | ICD-10-CM

## 2019-03-02 ENCOUNTER — Other Ambulatory Visit: Payer: Self-pay

## 2019-03-02 ENCOUNTER — Encounter: Payer: Self-pay | Admitting: Family Medicine

## 2019-03-02 ENCOUNTER — Ambulatory Visit (INDEPENDENT_AMBULATORY_CARE_PROVIDER_SITE_OTHER): Payer: No Typology Code available for payment source | Admitting: Family Medicine

## 2019-03-02 VITALS — BP 144/86

## 2019-03-02 DIAGNOSIS — R42 Dizziness and giddiness: Secondary | ICD-10-CM

## 2019-03-02 DIAGNOSIS — R5383 Other fatigue: Secondary | ICD-10-CM | POA: Diagnosis not present

## 2019-03-02 DIAGNOSIS — R7309 Other abnormal glucose: Secondary | ICD-10-CM | POA: Diagnosis not present

## 2019-03-02 DIAGNOSIS — E785 Hyperlipidemia, unspecified: Secondary | ICD-10-CM | POA: Diagnosis not present

## 2019-03-02 DIAGNOSIS — R030 Elevated blood-pressure reading, without diagnosis of hypertension: Secondary | ICD-10-CM

## 2019-03-02 LAB — HEMOGLOBIN A1C: Hgb A1c MFr Bld: 6.4 % (ref 4.6–6.5)

## 2019-03-02 LAB — CBC WITH DIFFERENTIAL/PLATELET
Basophils Absolute: 0.1 10*3/uL (ref 0.0–0.1)
Basophils Relative: 0.6 % (ref 0.0–3.0)
Eosinophils Absolute: 0.3 10*3/uL (ref 0.0–0.7)
Eosinophils Relative: 3.2 % (ref 0.0–5.0)
HCT: 41 % (ref 36.0–46.0)
Hemoglobin: 13.9 g/dL (ref 12.0–15.0)
Lymphocytes Relative: 31.4 % (ref 12.0–46.0)
Lymphs Abs: 3.4 10*3/uL (ref 0.7–4.0)
MCHC: 33.9 g/dL (ref 30.0–36.0)
MCV: 88.6 fl (ref 78.0–100.0)
Monocytes Absolute: 0.7 10*3/uL (ref 0.1–1.0)
Monocytes Relative: 6.3 % (ref 3.0–12.0)
Neutro Abs: 6.3 10*3/uL (ref 1.4–7.7)
Neutrophils Relative %: 58.5 % (ref 43.0–77.0)
Platelets: 284 10*3/uL (ref 150.0–400.0)
RBC: 4.63 Mil/uL (ref 3.87–5.11)
RDW: 13.1 % (ref 11.5–15.5)
WBC: 10.8 10*3/uL — ABNORMAL HIGH (ref 4.0–10.5)

## 2019-03-02 LAB — COMPREHENSIVE METABOLIC PANEL
ALT: 19 U/L (ref 0–35)
AST: 16 U/L (ref 0–37)
Albumin: 4.5 g/dL (ref 3.5–5.2)
Alkaline Phosphatase: 79 U/L (ref 39–117)
BUN: 12 mg/dL (ref 6–23)
CO2: 27 mEq/L (ref 19–32)
Calcium: 9.3 mg/dL (ref 8.4–10.5)
Chloride: 102 mEq/L (ref 96–112)
Creatinine, Ser: 0.81 mg/dL (ref 0.40–1.20)
GFR: 80.19 mL/min (ref >60.00)
Glucose, Bld: 93 mg/dL (ref 70–99)
Potassium: 4 mEq/L (ref 3.5–5.1)
Sodium: 139 mEq/L (ref 135–145)
Total Bilirubin: 0.5 mg/dL (ref 0.2–1.2)
Total Protein: 7.4 g/dL (ref 6.0–8.3)

## 2019-03-02 LAB — LIPID PANEL
Cholesterol: 157 mg/dL (ref 0–200)
HDL: 34.9 mg/dL — ABNORMAL LOW (ref >39.00)
NonHDL: 122.1
Total CHOL/HDL Ratio: 4
Triglycerides: 211 mg/dL — ABNORMAL HIGH (ref 0.0–149.0)
VLDL: 42.2 mg/dL — ABNORMAL HIGH (ref 0.0–40.0)

## 2019-03-02 LAB — VITAMIN B12: Vitamin B-12: 659 pg/mL (ref 211–911)

## 2019-03-02 LAB — LDL CHOLESTEROL, DIRECT: Direct LDL: 101 mg/dL

## 2019-03-02 LAB — TSH: TSH: 0.61 u[IU]/mL (ref 0.35–4.50)

## 2019-03-02 LAB — VITAMIN D 25 HYDROXY (VIT D DEFICIENCY, FRACTURES): VITD: 54.24 ng/mL (ref 30.00–100.00)

## 2019-03-02 NOTE — Progress Notes (Signed)
Subjective:    Patient ID: Felicia Mcdowell, female    DOB: 03/17/1981, 38 y.o.   MRN: [id number]  HPI This is a 38 year old female who presents today with concerns for elevated blood pressure.  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  She reports over the last month that she has had elevated blood pressures 140-150s over 90-100.  She denies headache.  She has had some lightheadedness.  She denies chest pain, shortness of breath.  She has occasional lower extremity edema after prolonged periods of standing. She admits that her diet is not great.  But does not feel that she is in a place to work on it right now.  Past Medical History:  Diagnosis Date  . Anxiety   . Back pain   . Constipation   . GERD (gastroesophageal reflux disease)   . Headache   . High cholesterol   . Prediabetes    Past Surgical History:  Procedure Laterality Date  . HYSTEROSCOPY WITH NOVASURE N/A 01/05/2016   Procedure: HYSTEROSCOPY WITH NOVASURE;  Surgeon: Elenora Fenderhelsea C Ward, MD;  Location: [facility] ORS;  Service: Gynecology;  Laterality: N/A;  . LAPAROSCOPIC TUBAL LIGATION Bilateral 01/05/2016   Procedure: bilateral laparoscopic salpingectomy, endometrial abalation;  Surgeon: Elenora Fenderhelsea C Ward, MD;  Location: [facility] ORS;  Service: Gynecology;  Laterality: Bilateral;  . TONSILLECTOMY     Family History  Problem Relation Age of Onset  . Cancer Mother 9740       breast CA, now with metastatic cancer  . Depression Mother   . Anxiety disorder Mother   . Breast cancer Mother [phone]  . Heart disease Father   . Hyperlipidemia Father   . Hypertension Father   . Sudden death Father   . Hypertension Maternal Grandmother   . Kidney disease Maternal Grandmother    Social History   Tobacco Use  . Smoking status: Never Smoker  . Smokeless tobacco: Never Used  Substance Use Topics  . Alcohol use: Yes    Comment: 1-2 glasses wine on weekend nights  . Drug use: No      Review of Systems Per HPI    Objective:   Physical Exam Vitals signs reviewed.  Constitutional:      General: She is not in acute distress.    Appearance: Normal appearance. She is obese. She is not ill-appearing, toxic-appearing or diaphoretic.  HENT:     Head: Normocephalic and atraumatic.  Eyes:     Conjunctiva/sclera: Conjunctivae normal.  Pulmonary:     Effort: Pulmonary effort is normal.  Neurological:     Mental Status: She is alert and oriented to person, place, and time.  Psychiatric:        Mood and Affect: Mood normal.        Behavior: Behavior normal.        Thought Content: Thought content normal.        Judgment: Judgment normal.       BP (!) 144/86  Wt Readings from Last 3 Encounters:  09/05/18 208 lb (94.3 kg)  08/27/18 200 lb (90.7 kg)  01/15/18 200 lb (90.7 kg)       Assessment & Plan:  1. Elevated blood pressure reading - CBC with Differential - TSH - will check labs and determine treatment and follow up  2. Lightheadedness - CBC with Differential - Vitamin B12 - Vitamin D, 25-hydroxy  3. Fatigue, unspecified type - Comprehensive metabolic panel - CBC with Differential - TSH - Vitamin B12 -  Vitamin D, 25-hydroxy  4. Hyperlipidemia, unspecified hyperlipidemia type - Lipid Panel  5. Elevated hemoglobin A1c - Hemoglobin A1c   [name], FNP-BC  [facility], [facility]  03/02/2019 4:42 PM

## 2019-03-03 ENCOUNTER — Other Ambulatory Visit: Payer: Self-pay | Admitting: Family Medicine

## 2019-03-03 DIAGNOSIS — I10 Essential (primary) hypertension: Secondary | ICD-10-CM

## 2019-03-03 MED ORDER — HYDROCHLOROTHIAZIDE 25 MG PO TABS: 25.0000 mg | ORAL_TABLET | Freq: Every day | ORAL | 3 refills | Status: DC

## 2019-03-03 NOTE — Progress Notes (Signed)
Orders for HCTZ, future BMET placed.

## 2019-03-19 ENCOUNTER — Other Ambulatory Visit: Payer: Self-pay

## 2019-03-19 DIAGNOSIS — J4520 Mild intermittent asthma, uncomplicated: Secondary | ICD-10-CM

## 2019-03-19 MED ORDER — FLUTICASONE PROPIONATE HFA 44 MCG/ACT IN AERO: 2.0000 | INHALATION_SPRAY | Freq: Two times a day (BID) | RESPIRATORY_TRACT | 0 refills | Status: DC

## 2019-03-19 MED FILL — FLOVENT HFA 44 MCG INHALER: 44 | 30 days supply | Qty: 11 | Fill #0

## 2019-03-20 MED ORDER — FLUTICASONE PROPIONATE HFA 44 MCG/ACT IN AERO: 2.0000 | INHALATION_SPRAY | Freq: Two times a day (BID) | RESPIRATORY_TRACT | 0 refills | Status: DC

## 2019-03-20 NOTE — Addendum Note (Signed)
Addended by: Littie Deeds Y on: 03/20/2019 12:12 PM   Modules accepted: Orders

## 2019-03-31 ENCOUNTER — Other Ambulatory Visit: Payer: Self-pay

## 2019-03-31 ENCOUNTER — Other Ambulatory Visit: Payer: No Typology Code available for payment source

## 2019-04-01 ENCOUNTER — Ambulatory Visit (INDEPENDENT_AMBULATORY_CARE_PROVIDER_SITE_OTHER): Payer: No Typology Code available for payment source | Admitting: Family Medicine

## 2019-04-01 ENCOUNTER — Other Ambulatory Visit (INDEPENDENT_AMBULATORY_CARE_PROVIDER_SITE_OTHER): Payer: No Typology Code available for payment source

## 2019-04-01 ENCOUNTER — Encounter: Payer: Self-pay | Admitting: Family Medicine

## 2019-04-01 VITALS — BP 144/82 | HR 95 | Temp 98.7°F | Wt 205.0 lb

## 2019-04-01 DIAGNOSIS — I10 Essential (primary) hypertension: Secondary | ICD-10-CM | POA: Diagnosis not present

## 2019-04-01 DIAGNOSIS — F5104 Psychophysiologic insomnia: Secondary | ICD-10-CM

## 2019-04-01 LAB — BASIC METABOLIC PANEL
BUN: 15 mg/dL (ref 6–23)
CO2: 30 mEq/L (ref 19–32)
Calcium: 9.6 mg/dL (ref 8.4–10.5)
Chloride: 101 mEq/L (ref 96–112)
Creatinine, Ser: 0.87 mg/dL (ref 0.40–1.20)
GFR: 73.81 mL/min (ref >60.00)
Glucose, Bld: 119 mg/dL — ABNORMAL HIGH (ref 70–99)
Potassium: 3.6 mEq/L (ref 3.5–5.1)
Sodium: 139 mEq/L (ref 135–145)

## 2019-04-01 MED ORDER — LOSARTAN POTASSIUM 50 MG PO TABS: 50.0000 mg | ORAL_TABLET | Freq: Every day | ORAL | 3 refills | Status: DC

## 2019-04-01 MED ORDER — TRAZODONE HCL 50 MG PO TABS: 25.0000 mg | ORAL_TABLET | Freq: Every evening | ORAL | 3 refills | Status: DC | PRN

## 2019-04-01 NOTE — Patient Instructions (Addendum)
Good to see you today  I have sent in two prescriptions- losartan and trazadone. Stay on your HCTZ for a couple of weeks and if bp consistently at goal (most readings less than 130/80), can stop HCTZ and see how it does.   If no improvement in sleep, I suggest you try diphenhydramine 12.5- 25 occasionally. Half life is shorter than hydroxyzine.   Follow up in 6 months.

## 2019-04-01 NOTE — Progress Notes (Signed)
Subjective:    Patient ID: Felicia Mcdowell, female    DOB: 05/19/1981, 38 y.o.   MRN: [id number]  HPI This is a 38 year old female, patient leaving the office.  She presents today for follow-up of essential hypertension and insomnia.   Essential hypertension-last month she was started on hydrochlorothiazide 25 mg.  She reports that her blood pressure has been essentially the same.  She checks it daily.  It has been running 120-150/ 80- 90.  She reports improvement of both headache and mild pedal edema.  Insomnia- she notices difficulty falling asleep to 3 times a week. Feels this is related to anxiety.  Interested in PRN med.  Hyperlipidemia- maintained on simvastatin 20 mg. No changes with diet. Eats fast food breakfast daily. Not in place to make changes currently.   [facility]  Past Medical History:  Diagnosis Date  . Anxiety   . Back pain   . Constipation   . GERD (gastroesophageal reflux disease)   . Headache   . High cholesterol   . Prediabetes    Past Surgical History:  Procedure Laterality Date  . HYSTEROSCOPY WITH NOVASURE N/A 01/05/2016   Procedure: HYSTEROSCOPY WITH NOVASURE;  Surgeon: Felicia Fenderhelsea C Ward, MD;  Location: [facility] ORS;  Service: Gynecology;  Laterality: N/A;  . LAPAROSCOPIC TUBAL LIGATION Bilateral 01/05/2016   Procedure: bilateral laparoscopic salpingectomy, endometrial abalation;  Surgeon: Felicia Fenderhelsea C Ward, MD;  Location: [facility] ORS;  Service: Gynecology;  Laterality: Bilateral;  . TONSILLECTOMY     Family History  Problem Relation Age of Onset  . Cancer Mother 3540       breast CA, now with metastatic cancer  . Depression Mother   . Anxiety disorder Mother   . Breast cancer Mother 4340  . Heart disease Father   . Hyperlipidemia Father   . Hypertension Father   . Sudden death Father   . Hypertension Maternal Grandmother   . Kidney disease Maternal Grandmother    Social History   Tobacco Use  .  Smoking status: Never Smoker  . Smokeless tobacco: Never Used  Substance Use Topics  . Alcohol use: Yes    Comment: 1-2 glasses wine on weekend nights  . Drug use: No      Review of Systems Per HPI    Objective:   Physical Exam Vitals signs reviewed.  Constitutional:      General: She is not in acute distress.    Appearance: She is obese. She is not ill-appearing, toxic-appearing or diaphoretic.  HENT:     Head: Normocephalic and atraumatic.  Eyes:     Conjunctiva/sclera: Conjunctivae normal.  Cardiovascular:     Rate and Rhythm: Normal rate.  Pulmonary:     Effort: Pulmonary effort is normal.  Neurological:     Mental Status: She is alert and oriented to person, place, and time.  Psychiatric:        Mood and Affect: Mood normal.        Behavior: Behavior normal.        Thought Content: Thought content normal.        Judgment: Judgment normal.       BP (!) 144/82   Pulse 95   Temp 98.7 F (37.1 C) (Oral)   Wt 205 lb (93 kg)   SpO2 99%   BMI 35.19 kg/m  BP Readings from Last 3 Encounters:  04/01/19 (!) 144/82  03/02/19 (!) 144/86  09/05/18 118/74  Wt Readings from Last 3 Encounters:  04/01/19 205 lb (93 kg)  09/05/18 208 lb (94.3 kg)  08/27/18 200 lb (90.7 kg)       Assessment & Plan:  1. Psychophysiological insomnia - traZODone (DESYREL) 50 MG tablet; Take 0.5-2 tablets (25-100 mg total) by mouth at bedtime as needed for sleep.  Dispense: 30 tablet; Refill: 3  2. Essential hypertension - she wil continue to do home readings - losartan (COZAAR) 50 MG tablet; Take 1 tablet (50 mg total) by mouth daily.  Dispense: 90 tablet; Refill: 3 - Ambulatory referral to Ophthalmology   [name], FNP-BC  [facility], [facility]  04/01/2019 4:40 PM

## 2019-05-12 ENCOUNTER — Other Ambulatory Visit: Payer: Self-pay

## 2019-05-12 MED ORDER — FLUOXETINE HCL 20 MG PO TABS: 20.0000 mg | ORAL_TABLET | Freq: Every day | ORAL | 1 refills | Status: DC

## 2019-05-12 MED ORDER — SIMVASTATIN 20 MG PO TABS: 20.0000 mg | ORAL_TABLET | Freq: Every day | ORAL | 1 refills | Status: DC

## 2019-07-01 ENCOUNTER — Other Ambulatory Visit: Payer: Self-pay

## 2019-07-01 DIAGNOSIS — I10 Essential (primary) hypertension: Secondary | ICD-10-CM

## 2019-07-01 MED ORDER — HYDROCHLOROTHIAZIDE 25 MG PO TABS: 25.0000 mg | ORAL_TABLET | Freq: Every day | ORAL | 1 refills | Status: DC

## 2019-07-06 ENCOUNTER — Other Ambulatory Visit: Payer: Self-pay

## 2019-07-06 ENCOUNTER — Ambulatory Visit (INDEPENDENT_AMBULATORY_CARE_PROVIDER_SITE_OTHER): Payer: No Typology Code available for payment source | Admitting: Primary Care

## 2019-07-06 DIAGNOSIS — R21 Rash and other nonspecific skin eruption: Secondary | ICD-10-CM

## 2019-07-06 MED ORDER — PREDNISONE 20 MG PO TABS: ORAL_TABLET | ORAL | 0 refills | Status: DC

## 2019-07-06 NOTE — Patient Instructions (Signed)
Start prednisone 20 mg tablets for rash. Take 1 tablet once daily for five days.   Please update me if no improvement within 3-4 days.  It was a pleasure to see you today!

## 2019-07-06 NOTE — Progress Notes (Signed)
Subjective:    Patient ID: Felicia Mcdowell, female    DOB: 12-12-1980, 38 y.o.   MRN: [id number]  HPI  Ms. Unterreiner is a 38 year old female with a history of hypertension, hyperlipidemia who presents today with a chief complaint of rash.  Her rash is located under the bilateral breasts and bilateral upper extremities for which she first noticed one week ago. She initially tried taking Zyrtec and applying clobetasol with temporary improvement, but since then her rash has progressed.  She denies changes in soaps, detergents, medications. She's not been outdoors or in the woods. She also denies wheezing, throat tightness, shortness of breath.   Review of Systems  Constitutional: Negative for fever.  HENT: Negative for trouble swallowing.   Respiratory: Negative for cough and shortness of breath.   Skin: Positive for rash.       Past Medical History:  Diagnosis Date  . Anxiety   . Back pain   . Constipation   . GERD (gastroesophageal reflux disease)   . Headache   . High cholesterol   . Prediabetes      Social History   Socioeconomic History  . Marital status: Married    Spouse name: jeff Laffey  . Number of children: 2  . Years of education: Not on file  . Highest education level: Not on file  Occupational History  . Occupation: Engineer, technical sales: [employer]  . Financial resource strain: Not on file  . Food insecurity    Worry: Not on file    Inability: Not on file  . Transportation needs    Medical: Not on file    Non-medical: Not on file  Tobacco Use  . Smoking status: Never Smoker  . Smokeless tobacco: Never Used  Substance and Sexual Activity  . Alcohol use: Yes    Comment: 1-2 glasses wine on weekend nights  . Drug use: No  . Sexual activity: Yes    Partners: Male    Birth control/protection: Surgical  Lifestyle  . Physical activity    Days per week: Not on file    Minutes per session: Not on file  . Stress: Not on file   Relationships  . Social Herbalist on phone: Not on file    Gets together: Not on file    Attends religious service: Not on file    Active member of club or organization: Not on file    Attends meetings of clubs or organizations: Not on file    Relationship status: Not on file  . Intimate partner violence    Fear of current or ex partner: Not on file    Emotionally abused: Not on file    Physically abused: Not on file    Forced sexual activity: Not on file  Other Topics Concern  . Not on file  Social History Narrative   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[employer]   Married, two daughters   Enjoys exercising, reading          Past Surgical History:  Procedure Laterality Date  . HYSTEROSCOPY WITH NOVASURE N/A 01/05/2016   Procedure: HYSTEROSCOPY WITH NOVASURE;  Surgeon: Honor Loh Ward, MD;  Location: [facility] ORS;  Service: Gynecology;  Laterality: N/A;  . LAPAROSCOPIC TUBAL LIGATION Bilateral 01/05/2016   Procedure: bilateral laparoscopic salpingectomy, endometrial abalation;  Surgeon: Honor Loh Ward, MD;  Location: [facility] ORS;  Service: Gynecology;  Laterality: Bilateral;  . TONSILLECTOMY  Family History  Problem Relation Age of Onset  . Cancer Mother 2040       breast CA, now with metastatic cancer  . Depression Mother   . Anxiety disorder Mother   . Breast cancer Mother 1840  . Heart disease Father   . Hyperlipidemia Father   . Hypertension Father   . Sudden death Father   . Hypertension Maternal Grandmother   . Kidney disease Maternal Grandmother     Allergies  Allergen Reactions  . Augmentin [Amoxicillin-Pot Clavulanate] Hives and Rash    Rash     Current Outpatient Medications on File Prior to Visit  Medication Sig Dispense Refill  . albuterol (PROVENTIL HFA;VENTOLIN HFA) 108 (90 Base) MCG/ACT inhaler Inhale 2 puffs into the lungs every 4 (four) hours as needed for wheezing or shortness of breath (cough, shortness of breath or wheezing.). 1 Inhaler 1  .  cetirizine (ZYRTEC) 10 MG tablet Take 10 mg by mouth 2 (two) times daily.    Marland Kitchen. FLUoxetine (PROZAC) 20 MG tablet Take 1 tablet (20 mg total) by mouth daily. 90 tablet 1  . fluticasone (FLOVENT HFA) 44 MCG/ACT inhaler Inhale 2 puffs into the lungs 2 (two) times daily. 1 Inhaler 0  . hydrochlorothiazide (HYDRODIURIL) 25 MG tablet Take 1 tablet (25 mg total) by mouth daily. 90 tablet 1  . HYDROcodone-homatropine (HYCODAN) 5-1.5 MG/5ML syrup Take 5 mLs by mouth every 8 (eight) hours as needed for cough. 100 mL 0  . losartan (COZAAR) 50 MG tablet Take 1 tablet (50 mg total) by mouth daily. 90 tablet 3  . simvastatin (ZOCOR) 20 MG tablet Take 1 tablet (20 mg total) by mouth at bedtime. 90 tablet 1  . traZODone (DESYREL) 50 MG tablet Take 0.5-2 tablets (25-100 mg total) by mouth at bedtime as needed for sleep. 30 tablet 3   No current facility-administered medications on file prior to visit.     There were no vitals taken for this visit.   Objective:   Physical Exam  Constitutional: She appears well-nourished.  Respiratory: Effort normal.  Skin: Skin is warm and dry. Rash noted.     Mild, bumpy/rasied, erythematous rash to upper extremities and under bilateral breasts.            Assessment & Plan:

## 2019-07-06 NOTE — Assessment & Plan Note (Signed)
Appears to be either contact dermatitis vs allergic dermatitis. Unclear etiology as nothing has changed in her day to day regimen.   Given wide spread nature we will treat with systemic steroid course, especially since she initially responded to topical steroids. Rx for low dose prednisone burst sent to pharmacy.  She will update.

## 2019-07-20 ENCOUNTER — Other Ambulatory Visit: Payer: Self-pay | Admitting: Obstetrics and Gynecology

## 2019-07-20 DIAGNOSIS — R928 Other abnormal and inconclusive findings on diagnostic imaging of breast: Secondary | ICD-10-CM

## 2019-07-20 DIAGNOSIS — N6320 Unspecified lump in the left breast, unspecified quadrant: Secondary | ICD-10-CM

## 2019-07-20 DIAGNOSIS — Z1231 Encounter for screening mammogram for malignant neoplasm of breast: Secondary | ICD-10-CM

## 2019-07-22 ENCOUNTER — Other Ambulatory Visit: Payer: Self-pay

## 2019-07-22 DIAGNOSIS — Z20822 Contact with and (suspected) exposure to covid-19: Secondary | ICD-10-CM

## 2019-07-23 LAB — NOVEL CORONAVIRUS, NAA: SARS-CoV-2, NAA: NOT DETECTED

## 2019-07-31 ENCOUNTER — Ambulatory Visit (INDEPENDENT_AMBULATORY_CARE_PROVIDER_SITE_OTHER): Payer: No Typology Code available for payment source | Admitting: Obstetrics & Gynecology

## 2019-07-31 ENCOUNTER — Other Ambulatory Visit: Payer: Self-pay

## 2019-07-31 ENCOUNTER — Telehealth: Payer: Self-pay | Admitting: Obstetrics & Gynecology

## 2019-07-31 ENCOUNTER — Encounter: Payer: Self-pay | Admitting: Obstetrics & Gynecology

## 2019-07-31 ENCOUNTER — Other Ambulatory Visit (HOSPITAL_COMMUNITY)
Admission: RE | Admit: 2019-07-31 | Discharge: 2019-07-31 | Disposition: A | Discharge status: Home / Self Care | Payer: No Typology Code available for payment source | Source: Ambulatory Visit | Attending: Obstetrics & Gynecology | Admitting: Obstetrics & Gynecology

## 2019-07-31 VITALS — BP 120/80 | Ht 63.5 in | Wt 205.0 lb

## 2019-07-31 DIAGNOSIS — Z124 Encounter for screening for malignant neoplasm of cervix: Secondary | ICD-10-CM | POA: Insufficient documentation

## 2019-07-31 DIAGNOSIS — Z01419 Encounter for gynecological examination (general) (routine) without abnormal findings: Secondary | ICD-10-CM

## 2019-07-31 DIAGNOSIS — R928 Other abnormal and inconclusive findings on diagnostic imaging of breast: Secondary | ICD-10-CM

## 2019-07-31 NOTE — Progress Notes (Signed)
HPI:      [name] is a 38 y.o. [id number] who LMP was Patient's last menstrual period was 07/21/2019., she presents today for her annual examination. The patient has no complaints today. The patient is sexually active. Her last pap: approximate date 2017 and was normal and last mammogram: approximate date 01/2019 and was normal and has had prior left breast abnormality requiring follow up- due for left breast US. The patient does perform self breast exams.  There is notable family history of breast or ovarian cancer in her family.  Mother, breast cancer, <40.  Has not had BRCA done.  The patient has regular exercise: yes.  The patient denies current symptoms of depression.    GYN History: Contraception: tubal ligation  PMHx: Past Medical History:  Diagnosis Date  . Anxiety   . Back pain   . Constipation   . GERD (gastroesophageal reflux disease)   . Headache   . High cholesterol   . Hypertension   . Prediabetes    Past Surgical History:  Procedure Laterality Date  . HYSTEROSCOPY WITH NOVASURE N/A 01/05/2016   Procedure: HYSTEROSCOPY WITH NOVASURE;  Surgeon: Honor Loh Ward, MD;  Location: [facility] ORS;  Service: Gynecology;  Laterality: N/A;  . LAPAROSCOPIC TUBAL LIGATION Bilateral 01/05/2016   Procedure: bilateral laparoscopic salpingectomy, endometrial abalation;  Surgeon: Honor Loh Ward, MD;  Location: [facility] ORS;  Service: Gynecology;  Laterality: Bilateral;  . TONSILLECTOMY     Family History  Problem Relation Age of Onset  . Cancer Mother 44       breast CA, now with metastatic cancer  . Depression Mother   . Anxiety disorder Mother   . Breast cancer Mother 5  . Heart disease Father   . Hyperlipidemia Father   . Hypertension Father   . Sudden death Father   . Hypertension Maternal Grandmother   . Kidney disease Maternal Grandmother    Social History   Tobacco Use  . Smoking status: Never Smoker  . Smokeless tobacco: Never Used  Substance Use Topics  . Alcohol  use: Yes    Comment: 1-2 glasses wine on weekend nights  . Drug use: No    Current Outpatient Medications:  .  albuterol (PROVENTIL HFA;VENTOLIN HFA) 108 (90 Base) MCG/ACT inhaler, Inhale 2 puffs into the lungs every 4 (four) hours as needed for wheezing or shortness of breath (cough, shortness of breath or wheezing.)., Disp: 1 Inhaler, Rfl: 1 .  cetirizine (ZYRTEC) 10 MG tablet, Take 10 mg by mouth 2 (two) times daily., Disp: , Rfl:  .  FLUoxetine (PROZAC) 20 MG tablet, Take 1 tablet (20 mg total) by mouth daily., Disp: 90 tablet, Rfl: 1 .  fluticasone (FLOVENT HFA) 44 MCG/ACT inhaler, Inhale 2 puffs into the lungs 2 (two) times daily., Disp: 1 Inhaler, Rfl: 0 .  hydrochlorothiazide (HYDRODIURIL) 25 MG tablet, Take 1 tablet (25 mg total) by mouth daily., Disp: 90 tablet, Rfl: 1 .  losartan (COZAAR) 50 MG tablet, Take 1 tablet (50 mg total) by mouth daily., Disp: 90 tablet, Rfl: 3 .  simvastatin (ZOCOR) 20 MG tablet, Take 1 tablet (20 mg total) by mouth at bedtime., Disp: 90 tablet, Rfl: 1 .  traZODone (DESYREL) 50 MG tablet, Take 0.5-2 tablets (25-100 mg total) by mouth at bedtime as needed for sleep., Disp: 30 tablet, Rfl: 3 .  HYDROcodone-homatropine (HYCODAN) 5-1.5 MG/5ML syrup, Take 5 mLs by mouth every 8 (eight) hours as needed for cough., Disp: 100 mL, Rfl: 0 .  predniSONE (DELTASONE) 20 MG tablet, Take 1 tablet by mouth once daily for 5-7 days., Disp: 7 tablet, Rfl: 0 Allergies: Augmentin [amoxicillin-pot clavulanate]  Review of Systems  Constitutional: Negative for chills, fever and malaise/fatigue.  HENT: Negative for congestion, sinus pain and sore throat.   Eyes: Negative for blurred vision and pain.  Respiratory: Negative for cough and wheezing.   Cardiovascular: Negative for chest pain and leg swelling.  Gastrointestinal: Negative for abdominal pain, constipation, diarrhea, heartburn, nausea and vomiting.  Genitourinary: Negative for dysuria, frequency, hematuria and urgency.   Musculoskeletal: Negative for back pain, joint pain, myalgias and neck pain.  Skin: Negative for itching and rash.  Neurological: Negative for dizziness, tremors and weakness.  Endo/Heme/Allergies: Does not bruise/bleed easily.  Psychiatric/Behavioral: Negative for depression. The patient is not nervous/anxious and does not have insomnia.     Objective: BP 120/80   Ht 5' 3.5" (1.613 m)   Wt 205 lb (93 kg)   LMP 07/21/2019   BMI 35.74 kg/m   Filed Weights   07/31/19 0836  Weight: 205 lb (93 kg)   Body mass index is 35.74 kg/m. Physical Exam Constitutional:      General: She is not in acute distress.    Appearance: She is well-developed.  Genitourinary:     Pelvic exam was performed with patient supine.     Vagina, uterus and rectum normal.     No lesions in the vagina.     No vaginal bleeding.     No cervical motion tenderness, friability, lesion or polyp.     Uterus is mobile.     Uterus is not enlarged.     No uterine mass detected.    Uterus is midaxial.     No right or left adnexal mass present.     Right adnexa not tender.     Left adnexa not tender.  HENT:     Head: Normocephalic and atraumatic. No laceration.     Right Ear: Hearing normal.     Left Ear: Hearing normal.     Mouth/Throat:     Pharynx: Uvula midline.  Eyes:     Pupils: Pupils are equal, round, and reactive to light.  Neck:     Musculoskeletal: Normal range of motion and neck supple.     Thyroid: No thyromegaly.  Cardiovascular:     Rate and Rhythm: Normal rate and regular rhythm.     Heart sounds: No murmur. No friction rub. No gallop.   Pulmonary:     Effort: Pulmonary effort is normal. No respiratory distress.     Breath sounds: Normal breath sounds. No wheezing.  Chest:     Breasts:        Right: No mass, skin change or tenderness.        Left: No mass, skin change or tenderness.  Abdominal:     General: Bowel sounds are normal. There is no distension.     Palpations: Abdomen is soft.      Tenderness: There is no abdominal tenderness. There is no rebound.  Musculoskeletal: Normal range of motion.  Neurological:     Mental Status: She is alert and oriented to person, place, and time.     Cranial Nerves: No cranial nerve deficit.  Skin:    General: Skin is warm and dry.  Psychiatric:        Judgment: Judgment normal.  Vitals signs reviewed.     Assessment:  ANNUAL EXAM 1. Women's annual routine gynecological examination  2. Screening for cervical cancer   3. Abnormal mammogram      Screening Plan:            1.  Cervical Screening-  Pap smear done today  2. Breast screening- Exam annually and mammogram>40 planned, due for left breast US as well now; to schedule  3. Labs managed by PCP  4. Counseling for contraception: bilateral tubal ligation     F/U  Return in about 1 year (around 07/30/2020) for Annual.  Barnett Applebaum, MD, [facility], [facility] 07/31/2019  9:08 AM

## 2019-07-31 NOTE — Telephone Encounter (Signed)
Left message on patient VM letting her know her appointment time and date with [facility]. Patient is scheduled for 08/20/2019 at 9:40am.

## 2019-08-03 LAB — CYTOLOGY - PAP
Chlamydia: NEGATIVE
Diagnosis: NEGATIVE
HPV: DETECTED — AB
Neisseria Gonorrhea: NEGATIVE
Trichomonas: NEGATIVE

## 2019-08-13 ENCOUNTER — Other Ambulatory Visit: Payer: Self-pay | Admitting: Family Medicine

## 2019-08-13 DIAGNOSIS — R05 Cough: Secondary | ICD-10-CM

## 2019-08-13 DIAGNOSIS — R053 Chronic cough: Secondary | ICD-10-CM

## 2019-08-20 ENCOUNTER — Other Ambulatory Visit: Payer: No Typology Code available for payment source

## 2019-08-26 ENCOUNTER — Encounter: Payer: Self-pay | Admitting: Family Medicine

## 2019-08-26 ENCOUNTER — Ambulatory Visit (INDEPENDENT_AMBULATORY_CARE_PROVIDER_SITE_OTHER): Payer: No Typology Code available for payment source | Admitting: Family Medicine

## 2019-08-26 VITALS — BP 118/80 | HR 114 | Temp 98.2°F | Ht 63.5 in | Wt 210.4 lb

## 2019-08-26 DIAGNOSIS — F329 Major depressive disorder, single episode, unspecified: Secondary | ICD-10-CM

## 2019-08-26 DIAGNOSIS — R0609 Other forms of dyspnea: Secondary | ICD-10-CM

## 2019-08-26 DIAGNOSIS — R000 Tachycardia, unspecified: Secondary | ICD-10-CM

## 2019-08-26 DIAGNOSIS — Z8249 Family history of ischemic heart disease and other diseases of the circulatory system: Secondary | ICD-10-CM | POA: Diagnosis not present

## 2019-08-26 DIAGNOSIS — E782 Mixed hyperlipidemia: Secondary | ICD-10-CM

## 2019-08-26 DIAGNOSIS — Z6836 Body mass index (BMI) 36.0-36.9, adult: Secondary | ICD-10-CM

## 2019-08-26 DIAGNOSIS — R0600 Dyspnea, unspecified: Secondary | ICD-10-CM

## 2019-08-26 DIAGNOSIS — F419 Anxiety disorder, unspecified: Secondary | ICD-10-CM

## 2019-08-26 DIAGNOSIS — F32A Depression, unspecified: Secondary | ICD-10-CM

## 2019-08-26 DIAGNOSIS — R7303 Prediabetes: Secondary | ICD-10-CM

## 2019-08-26 DIAGNOSIS — E6609 Other obesity due to excess calories: Secondary | ICD-10-CM

## 2019-08-26 NOTE — Progress Notes (Signed)
Subjective:    Patient ID: Felicia Mcdowell, female    DOB: 1981/03/06, 38 y.o.   MRN: [id number]  HPI This is a 38 yo female who presents today for follow up of medical conditions.   Depression/stress- stress level very high. She is separated from her husband. She bought a house earlier this month. Job is stressful. She requests referral to therapist.  Does not sleep well, sleeping maybe 3 to 4 hours several nights a week.  She has trazodone and reports that she sleeps well if she takes 25 mg but is concerned that she would not wake up of her children needs something.  Obesity- weight up and down. Was successful in past with [facility], lost 40 pounds but regained 32.  She is also done [facility] weight loss management without significant results.  She eats when her stress level is high. Makes consistently poor choices, eats biscuits x 2 daily, drinks 5-6 diet Pepsis a day. Eats fast food frequently. Feels that she knows what she needs to do but struggles with motivation.  Is interested in resuming exercise.  She requests referral to bariatric surgery in part hoping that this will motivate her to lose some weight on her own.  DOE-longstanding dyspnea on exertion, elevated lipids and strong family history of early CAD.  She requests referral to cardiology to further discuss this and see if she needs any stress testing.   Review of Systems Per HPI    Objective:   Physical Exam Vitals signs reviewed.  Constitutional:      General: She is not in acute distress.    Appearance: Normal appearance. She is obese. She is not ill-appearing, toxic-appearing or diaphoretic.  HENT:     Head: Normocephalic and atraumatic.  Eyes:     Conjunctiva/sclera: Conjunctivae normal.  Cardiovascular:     Rate and Rhythm: Tachycardia present.     Comments: Patient reports her heart rate always runs high, usually about 100 when she is awake and according to her Fitbit, around 90 when she sleeps. Pulmonary:     Effort:  Pulmonary effort is normal.  Neurological:     Mental Status: She is alert and oriented to person, place, and time.  Psychiatric:        Mood and Affect: Mood normal.        Behavior: Behavior normal.        Thought Content: Thought content normal.        Judgment: Judgment normal.       BP 118/80 (BP Location: Left Arm, Patient Position: Sitting, Cuff Size: Normal)   Pulse (!) 114   Temp 98.2 F (36.8 C) (Temporal)   Ht 5' 3.5" (1.613 m)   Wt 210 lb 6.4 oz (95.4 kg)   SpO2 98%   BMI 36.69 kg/m  Wt Readings from Last 3 Encounters:  08/26/19 210 lb 6.4 oz (95.4 kg)  07/31/19 205 lb (93 kg)  07/06/19 205 lb (93 kg)       Assessment & Plan:  1. Class 2 obesity due to excess calories without serious comorbidity with body mass index (BMI) of 36.0 to 36.9 in adult -Discussed factors along with diet is not impede weight loss including insomnia and stress.  Provided her written information. - Ambulatory referral to Cardiology - Amb Referral to Bariatric Surgery  2. Mixed hyperlipidemia - Ambulatory referral to Cardiology -Continue simvastatin 20 mg daily  3. Anxiety and depression - Ambulatory referral to Psychology  4. Family history of  early CAD - Ambulatory referral to Cardiology - Amb Referral to Bariatric Surgery  5. DOE (dyspnea on exertion) -Deconditioning likely major component, encouraged her to walk daily - Ambulatory referral to Cardiology  6. Tachycardia - Ambulatory referral to Cardiology  7. Prediabetes - Hgba1c - Amb Referral to Bariatric Surgery   [name], FNP-BC  [facility], [facility]  08/27/2019 3:43 PM

## 2019-08-26 NOTE — Patient Instructions (Signed)
Get to the point iwht Felicia Mcdowell- fat loss lifestyle  A resource that I like is www.dietdoctor.com/diabetes/diet  I recommend you check your blood sugar daily and keep a log.  Very the time you check your blood sugar such as fasting, before meal, 2 hours after a meal and at bedtime.  Look for trends with the foods you are eating and be a scientist of your body.  Here are some guidelines to help you with meal planning -  Avoid all processed and packaged foods (bread, pasta, crackers, chips, etc) and beverages containing calories.  Avoid added sugars and excessive natural sugars.  Attention to how you feel if you consume artificial sweeteners.  Do they make you more hungry or raise your blood sugar?  With every meal and snack, aim to get 20 g of protein (3 ounces of meat, 4 ounces of fish, 3 eggs, protein powder, 1 cup [place] yogurt, 1 cup cottage cheese, etc.)  Increase fiber in the form of non-starchy vegetables.  These help you feel full with very little carbohydrates and are good for gut health.  Eat 1 serving healthy carb per meal- 1/2 cup brown rice, beans, potato, corn- pay attention to whether or not this significantly raises your blood sugar. If it does, reduce the frequency you consume these.   Eat 2-3 servings of lower sugar fruits daily.  This includes berries, apples, oranges, peaches, pears, one half banana.  Have small amounts of good fats such as avocado, nuts, olive oil, nut butters, olives.  Add a little cheese to your salads to make them tasty.

## 2019-08-27 ENCOUNTER — Encounter: Payer: Self-pay | Admitting: Family Medicine

## 2019-08-27 NOTE — Addendum Note (Signed)
Addended by: Clarene Reamer B on: 08/27/2019 03:49 PM   Modules accepted: Orders

## 2019-09-02 ENCOUNTER — Other Ambulatory Visit: Payer: Self-pay

## 2019-09-02 ENCOUNTER — Telehealth (INDEPENDENT_AMBULATORY_CARE_PROVIDER_SITE_OTHER): Payer: No Typology Code available for payment source | Admitting: Cardiovascular Disease

## 2019-09-02 VITALS — Ht 63.5 in | Wt 205.0 lb

## 2019-09-02 DIAGNOSIS — R0602 Shortness of breath: Secondary | ICD-10-CM

## 2019-09-02 DIAGNOSIS — E782 Mixed hyperlipidemia: Secondary | ICD-10-CM

## 2019-09-02 DIAGNOSIS — Z8249 Family history of ischemic heart disease and other diseases of the circulatory system: Secondary | ICD-10-CM

## 2019-09-02 NOTE — Progress Notes (Signed)
Virtual Visit via Video Note   This visit type was conducted due to national recommendations for restrictions regarding the COVID-19 Pandemic (e.g. social distancing) in an effort to limit this patient's exposure and mitigate transmission in our community.  Due to her co-morbid illnesses, this patient is at least at moderate risk for complications without adequate follow up.  This format is felt to be most appropriate for this patient at this time.  All issues noted in this document were discussed and addressed.  A limited physical exam was performed with this format.  Please refer to the patient's chart for her consent to telehealth for [facility].   I connected with  Felicia Mcdowell on 09/02/19 by a video enabled telemedicine application and verified that I am speaking with the correct person using two identifiers. I discussed the limitations of evaluation and management by telemedicine. The patient expressed understanding and agreed to proceed.   Evaluation Performed:  Follow-up visit  Date:  09/02/2019   ID:  Felicia Mcdowell, Felicia Mcdowell 1980-11-29, MRN [id number]  Patient Location:  [id number] [place] [address]   Provider location:   [facility], [place] office  PCP:  Elby Beck, FNP  Cardiologist:  Arvid Right [facility]   Chief Complaint:  Family hx of coronary disease   History of Present Illness:    Felicia Mcdowell is a 38 y.o. female who presents via audio/video conferencing for a telehealth visit today.   The patient does not symptoms concerning for COVID-19 infection (fever, chills, cough, or new SHORTNESS OF BREATH).   Patient has a past medical history of Hyperlipidemia Family history coronary disease Who presents for evaluation of shortness of breath, family history coronary disease  Reports recent stressors, difficult year, weight gain over the past year Has some shortness of breath climbing stairs, hills, okay on the flat Busy work  schedule Minimal lower extremity edema, typically HCTZ takes care of it Elevated blood pressure has been a new issue this year, Also with chronic cough which is better on Flovent  Reports she has been on cholesterol medication since she was 20 Recent lab work reviewed  Lab Results  Component Value Date   CHOL 157 03/02/2019   HDL 34.90 (L) 03/02/2019   [place] 65 01/02/2017   TRIG 211.0 (H) 03/02/2019   Prior cholesterol 230 in 12-Mar-2014  HAB1C 6.4  Father died: 63 yr, smoker  Prior CV studies:   The following studies were reviewed today:   Past Medical History:  Diagnosis Date  . Anxiety   . Back pain   . Constipation   . GERD (gastroesophageal reflux disease)   . Headache   . High cholesterol   . Hypertension   . Prediabetes    Past Surgical History:  Procedure Laterality Date  . HYSTEROSCOPY WITH NOVASURE N/A 01/05/2016   Procedure: HYSTEROSCOPY WITH NOVASURE;  Surgeon: Honor Loh Ward, MD;  Location: [facility] ORS;  Service: Gynecology;  Laterality: N/A;  . LAPAROSCOPIC TUBAL LIGATION Bilateral 01/05/2016   Procedure: bilateral laparoscopic salpingectomy, endometrial abalation;  Surgeon: Honor Loh Ward, MD;  Location: [facility] ORS;  Service: Gynecology;  Laterality: Bilateral;  . TONSILLECTOMY        Allergies:   Augmentin [amoxicillin-pot clavulanate]   Social History   Tobacco Use  . Smoking status: Never Smoker  . Smokeless tobacco: Never Used  Substance Use Topics  . Alcohol use: Yes    Comment: 1-2 glasses wine on weekend nights  .  Drug use: No     Current Outpatient Medications on File Prior to Visit  Medication Sig Dispense Refill  . cetirizine (ZYRTEC) 10 MG tablet Take 10 mg by mouth daily.     Marland Kitchen FLUoxetine (PROZAC) 20 MG tablet Take 1 tablet (20 mg total) by mouth daily. 90 tablet 1  . fluticasone (FLOVENT HFA) 44 MCG/ACT inhaler Inhale 2 puffs into the lungs 2 (two) times daily. 1 Inhaler 0  . hydrochlorothiazide (HYDRODIURIL) 25 MG tablet Take 1 tablet  (25 mg total) by mouth daily. 90 tablet 1  . losartan (COZAAR) 50 MG tablet Take 1 tablet (50 mg total) by mouth daily. 90 tablet 3  . simvastatin (ZOCOR) 20 MG tablet Take 1 tablet (20 mg total) by mouth at bedtime. 90 tablet 1  . traZODone (DESYREL) 50 MG tablet Take 0.5-2 tablets (25-100 mg total) by mouth at bedtime as needed for sleep. 30 tablet 3  . VENTOLIN HFA 108 (90 Base) MCG/ACT inhaler INHALE 2 PUFFS INTO THE LUNGS EVERY 4 (FOUR) HOURS AS NEEDED FOR WHEEZING OR SHORTNESS OF BREATH (COUGH, SHORTNESS OF BREATH OR WHEEZING.). 18 g 1   No current facility-administered medications on file prior to visit.      Family Hx: The patient's family history includes Anxiety disorder in her mother; Breast cancer (age of onset: 95) in her mother; Cancer (age of onset: 83) in her mother; Depression in her mother; Heart disease in her father; Hyperlipidemia in her father; Hypertension in her father and maternal grandmother; Kidney disease in her maternal grandmother; Sudden death in her father.  ROS:   Please see the history of present illness.    Review of Systems  Constitutional: Negative.   HENT: Negative.   Respiratory: Positive for cough and shortness of breath.   Cardiovascular: Negative.   Gastrointestinal: Negative.   Musculoskeletal: Negative.   Neurological: Negative.   Psychiatric/Behavioral: Negative.   All other systems reviewed and are negative.    Labs/Other Tests and Data Reviewed:    Recent Labs: 03/02/2019: ALT 19; Hemoglobin 13.9; Platelets 284.0; TSH 0.61 04/01/2019: BUN 15; Creatinine, Ser 0.87; Potassium 3.6; Sodium 139   Recent Lipid Panel Lab Results  Component Value Date/Time   CHOL 157 03/02/2019 02:49 PM   CHOL 114 01/02/2017 09:46 AM   TRIG 211.0 (H) 03/02/2019 02:49 PM   HDL 34.90 (L) 03/02/2019 02:49 PM   HDL 39 (L) 01/02/2017 09:46 AM   CHOLHDL 4 03/02/2019 02:49 PM   LDLCALC 65 01/02/2017 09:46 AM   LDLDIRECT 101.0 03/02/2019 02:49 PM    Wt  Readings from Last 3 Encounters:  09/02/19 205 lb (93 kg)  08/26/19 210 lb 6.4 oz (95.4 kg)  07/31/19 205 lb (93 kg)     Exam:    Vital Signs: Vital signs may also be detailed in the HPI Ht 5' 3.5" (1.613 m)   Wt 205 lb (93 kg)   BMI 35.74 kg/m   Wt Readings from Last 3 Encounters:  09/02/19 205 lb (93 kg)  08/26/19 210 lb 6.4 oz (95.4 kg)  07/31/19 205 lb (93 kg)   Temp Readings from Last 3 Encounters:  08/26/19 98.2 F (36.8 C) (Temporal)  07/06/19 98 F (36.7 C) (Temporal)  04/01/19 98.7 F (37.1 C) (Oral)   BP Readings from Last 3 Encounters:  08/26/19 118/80  07/31/19 120/80  07/06/19 (!) 144/82   Pulse Readings from Last 3 Encounters:  08/26/19 (!) 114  07/06/19 90  04/01/19 95     Well  nourished, well developed female in no acute distress. Constitutional:  oriented to person, place, and time. No distress.     ASSESSMENT & PLAN:    Problem List Items Addressed This Visit      Cardiology Problems   HLD (hyperlipidemia)    Other Visit Diagnoses    Shortness of breath    -  Primary   Family history of coronary artery disease         Shortness of breath Possibly multifactorial, she reports recent weight gain Symptoms with hills and stairs less on the flat Discussed various imaging modalities for work-up including CT coronary calcium scoring and echocardiogram -We will start for now with CT coronary calcium scoring for risk stratification -Echocardiogram later date if symptoms get worse She is aware of need for exercise program, weight loss, lifestyle modification  Hyperlipidemia Cholesterol is at goal on the current lipid regimen. No changes to the medications were made.  Family history coronary disease Father died age 36, was smoker CT coronary calcium scoring for risk stratification  Cough Variant of asthma, symptoms improving on Flovent Seem to start after using a vape 18 months ago  COVID-19 Education: The signs and symptoms of COVID-19  were discussed with the patient and how to seek care for testing (follow up with PCP or arrange E-visit).  The importance of social distancing was discussed today.  Patient Risk:   After full review of this patients clinical status, I feel that they are at least moderate risk at this time.  Time:   Today, I have spent 15 minutes with the patient with telehealth technology discussing the cardiac and medical problems/diagnoses detailed above   Additional 10 min spent reviewing the chart prior to patient visit today   Medication Adjustments/Labs and Tests Ordered: Current medicines are reviewed at length with the patient today.  Concerns regarding medicines are outlined above.   Tests Ordered: No tests ordered   Medication Changes: No changes made   Disposition: Follow-up as needed   Signed, Julien Nordmannimothy Gollan, MD  [facility] [place] Office [address], [place], [place] [address]

## 2019-09-02 NOTE — Patient Instructions (Addendum)
Medications: No changes  If you need a refill on your cardiac medications before your next appointment, please call your pharmacy.    Lab work: No new labs needed   If you have labs (blood work) drawn today and your tests are completely normal, you will receive your results only by: Marland Kitchen MyChart Message (if you have MyChart) OR . A paper copy in the mail If you have any lab test that is abnormal or we need to change your treatment, we will call you to review the results.   Testing/Procedures: Your physician has recommended that you have a CT coronary calcium score.  There is $150 out of pocket cost for this test Your order has been placed  Call [phone] to schedule   [address] [place], [place] [address]   Follow-Up: At [facility], you and your health needs are our priority.  As part of our continuing mission to provide you with exceptional heart care, we have created designated Provider Care Teams.  These Care Teams include your primary Cardiologist (physician) and Advanced Practice Providers (APPs -  Physician Assistants and Nurse Practitioners) who all work together to provide you with the care you need, when you need it.  . You will need a follow up appointment as needed   . Providers on your designated Care Team:   . Murray Hodgkins, NP . Christell Faith, PA-C . Marrianne Mood, PA-C  Any Other Special Instructions Will Be Listed Below (If Applicable).  For educational health videos Log in to : [email or web address] Or : [email or web address], password : [email or web address]

## 2019-09-11 ENCOUNTER — Encounter: Payer: Self-pay | Admitting: Obstetrics and Gynecology

## 2019-09-11 ENCOUNTER — Ambulatory Visit
Admission: RE | Admit: 2019-09-11 | Discharge: 2019-09-11 | Disposition: A | Discharge status: Home / Self Care | Payer: No Typology Code available for payment source | Source: Ambulatory Visit | Attending: Obstetrics and Gynecology | Admitting: Obstetrics and Gynecology

## 2019-09-11 ENCOUNTER — Other Ambulatory Visit: Payer: Self-pay

## 2019-09-11 DIAGNOSIS — Z1231 Encounter for screening mammogram for malignant neoplasm of breast: Secondary | ICD-10-CM | POA: Insufficient documentation

## 2019-09-11 DIAGNOSIS — N6320 Unspecified lump in the left breast, unspecified quadrant: Secondary | ICD-10-CM | POA: Insufficient documentation

## 2019-09-11 DIAGNOSIS — R928 Other abnormal and inconclusive findings on diagnostic imaging of breast: Secondary | ICD-10-CM | POA: Diagnosis present

## 2019-09-18 IMAGING — MG MM DIGITAL SCREENING BILAT W/ TOMO W/ CAD
8 series · 8 of 24 positions shown · non-contrast
Comparison: None.

CLINICAL DATA: Screening.

EXAM:
DIGITAL SCREENING BILATERAL MAMMOGRAM WITH TOMO AND CAD

[L CC synth-2D]
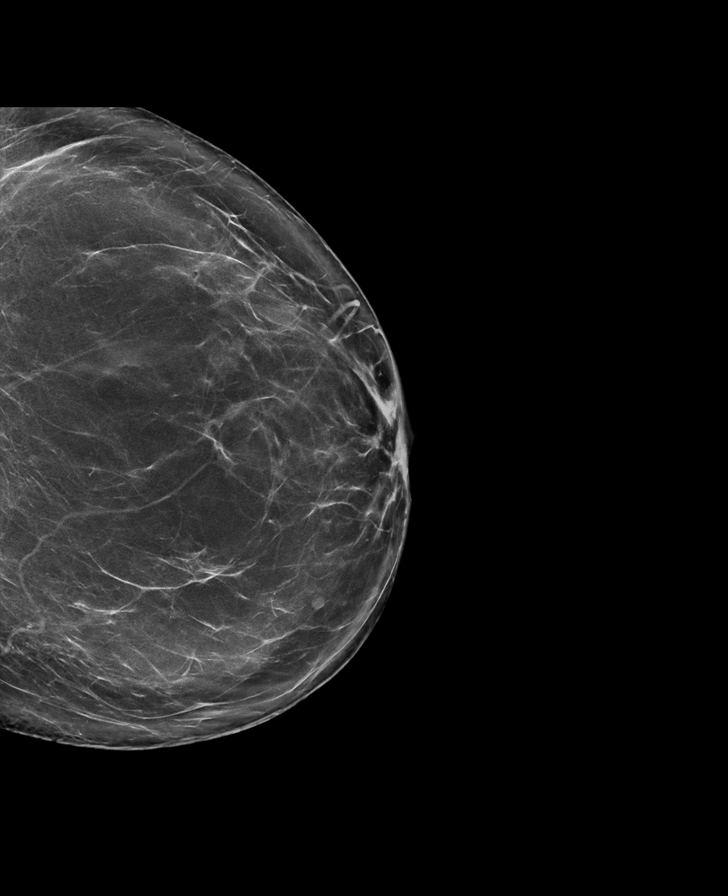

[R CC synth-2D]
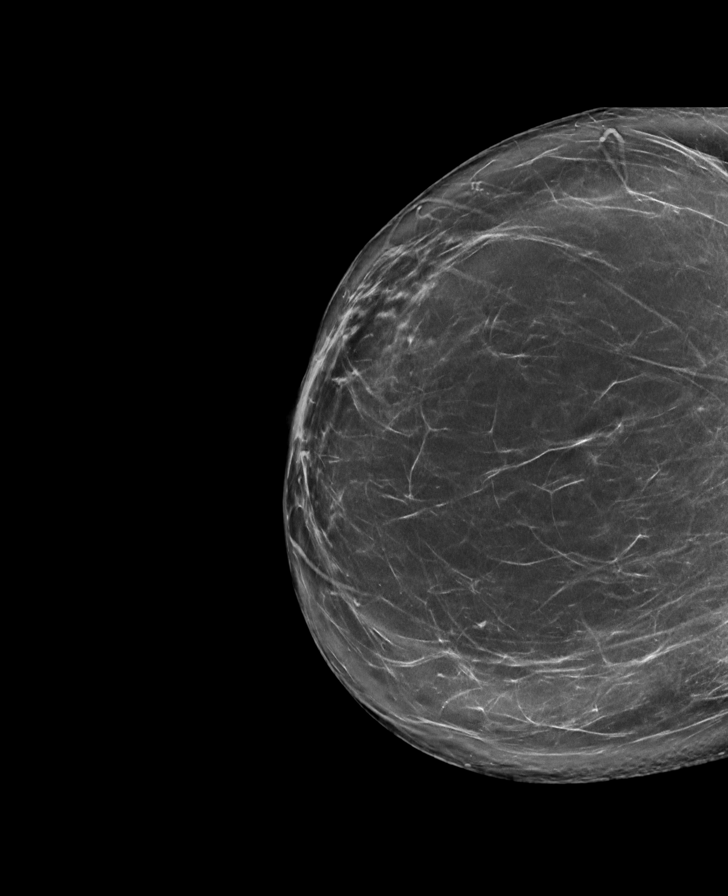

[R MLO synth-2D]
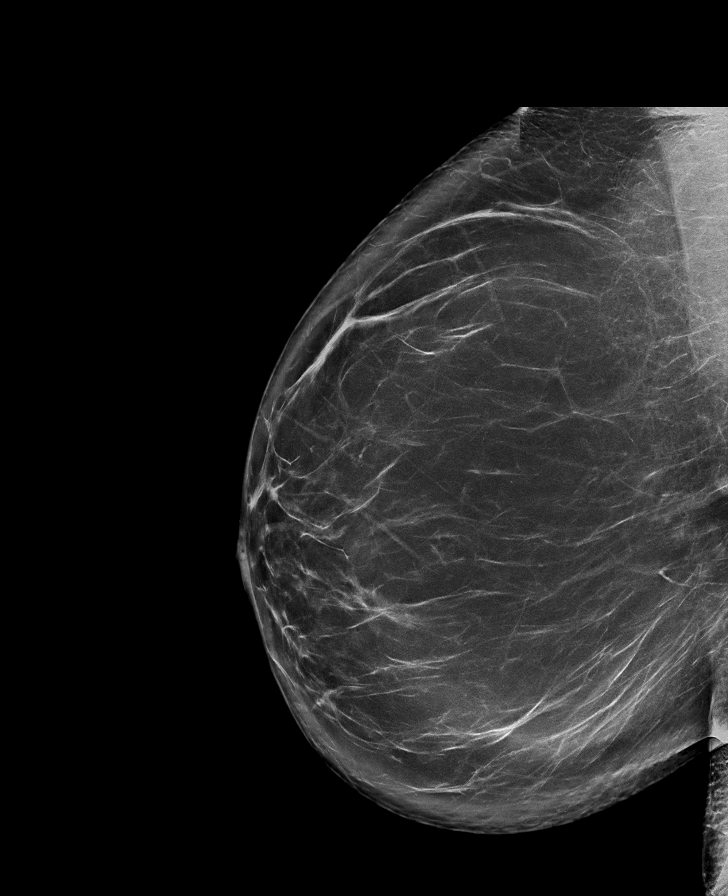

[L MLO synth-2D]
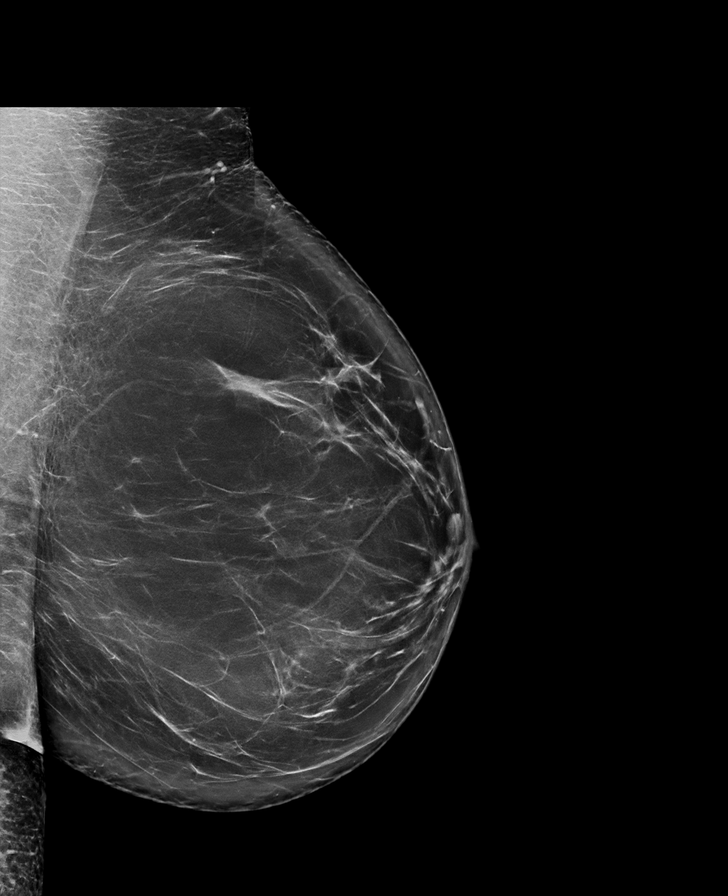

[R MLO tomo · tomo slice 49/96.0]
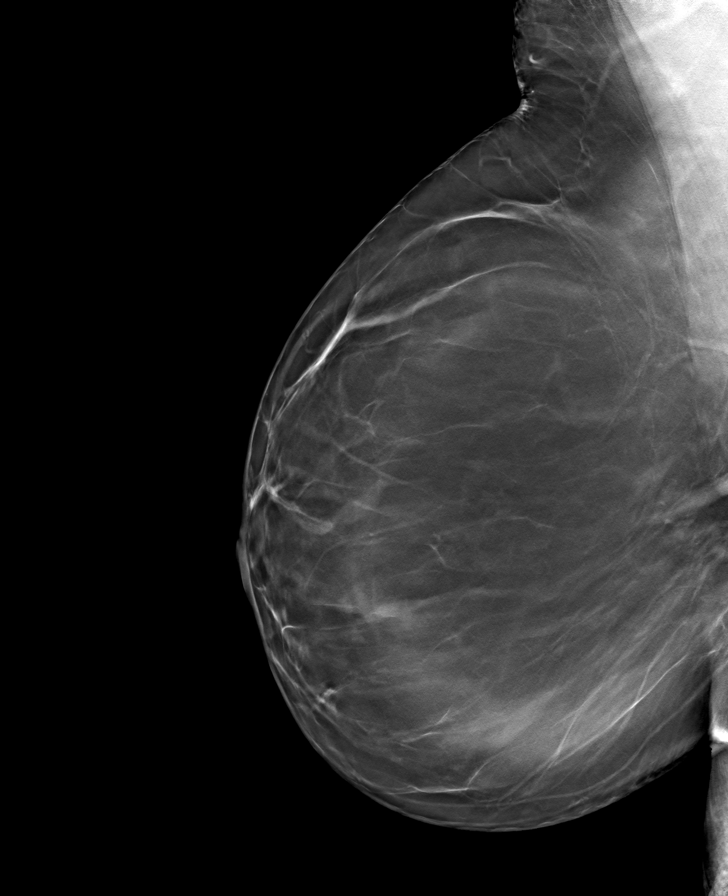

[L CC tomo · tomo slice 41/81.0]
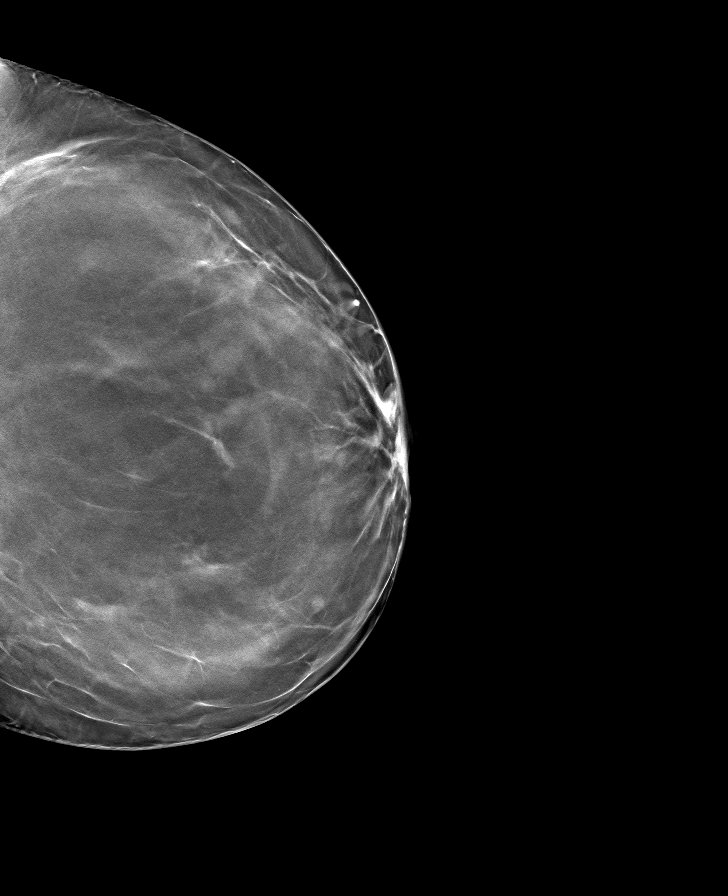

[R CC tomo · tomo slice 45/90.0]
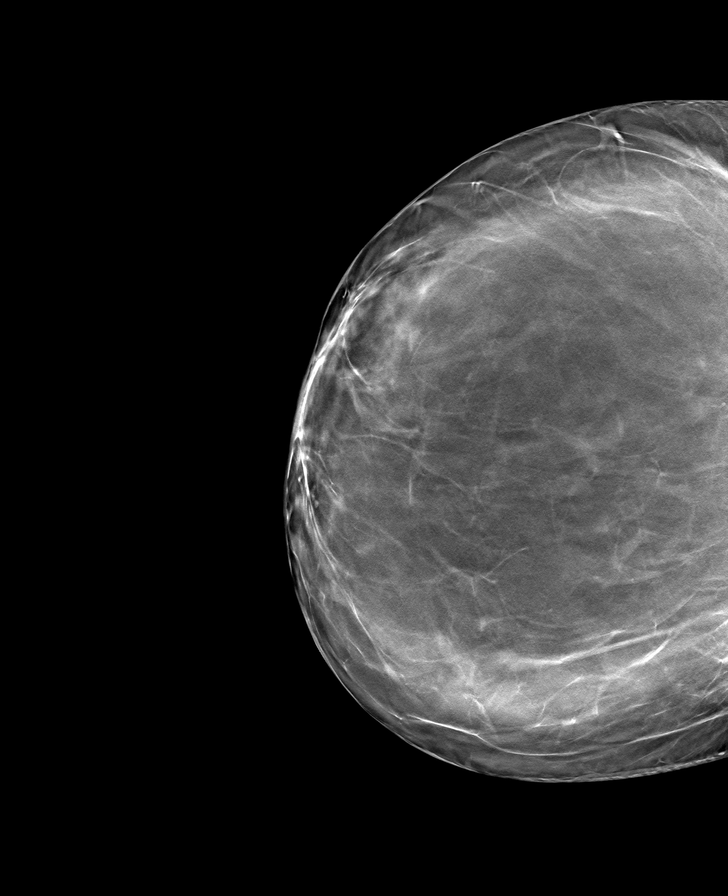

[L MLO tomo · tomo slice 47/94.0]
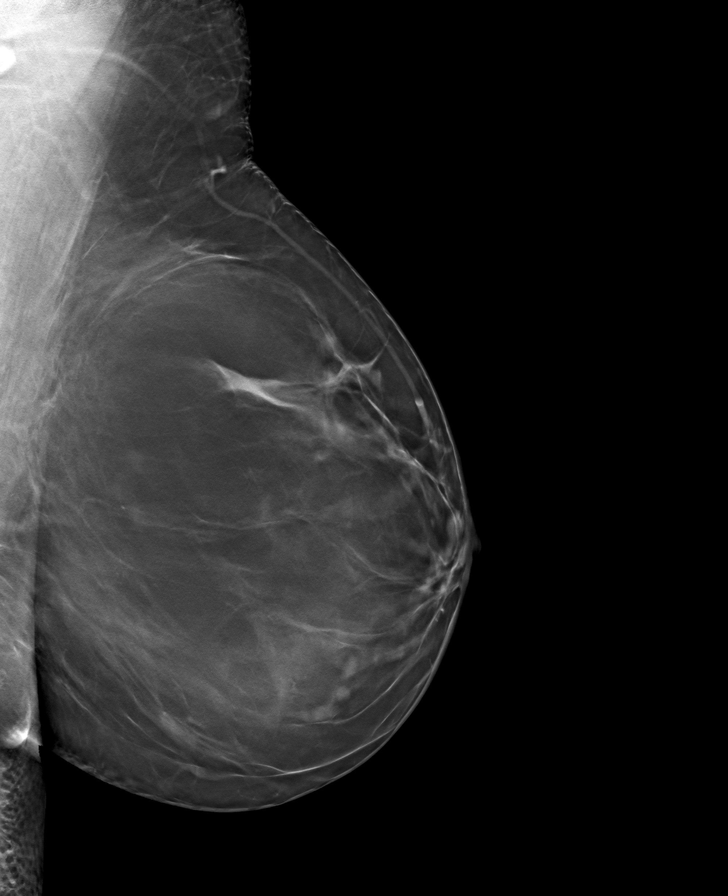

[8 of 24 positions shown; findings below may reference images not displayed]

ACR Breast Density Category b: There are scattered areas of
fibroglandular density.
FINDINGS: In the left breast, a possible mass warrants further evaluation. In
the right breast, no findings suspicious for malignancy.

Images were processed with CAD.
IMPRESSION: Further evaluation is suggested for possible mass in the left
breast.

RECOMMENDATION:
Diagnostic mammogram and possibly ultrasound of the left breast.
(Code:[id number]-[id number])

The patient will be contacted regarding the findings, and additional
imaging will be scheduled.

BI-RADS CATEGORY  0: Incomplete. Need additional imaging evaluation
and/or prior mammograms for comparison.

## 2019-09-30 ENCOUNTER — Other Ambulatory Visit: Payer: Self-pay

## 2019-09-30 ENCOUNTER — Ambulatory Visit (INDEPENDENT_AMBULATORY_CARE_PROVIDER_SITE_OTHER)
Admission: RE | Admit: 2019-09-30 | Discharge: 2019-09-30 | Disposition: A | Discharge status: Home / Self Care | Payer: Self-pay | Source: Ambulatory Visit | Attending: Cardiovascular Disease | Admitting: Cardiovascular Disease

## 2019-09-30 DIAGNOSIS — Z8249 Family history of ischemic heart disease and other diseases of the circulatory system: Secondary | ICD-10-CM

## 2019-12-03 ENCOUNTER — Other Ambulatory Visit: Payer: Self-pay | Admitting: Family Medicine

## 2019-12-04 NOTE — Telephone Encounter (Signed)
Last apt 08/26/19. Refills appropriate

## 2019-12-21 HISTORY — PX: AUGMENTATION MAMMAPLASTY: SUR837

## 2020-01-01 ENCOUNTER — Other Ambulatory Visit: Payer: Self-pay | Admitting: Family Medicine

## 2020-01-01 ENCOUNTER — Other Ambulatory Visit (INDEPENDENT_AMBULATORY_CARE_PROVIDER_SITE_OTHER): Payer: No Typology Code available for payment source

## 2020-01-01 DIAGNOSIS — E782 Mixed hyperlipidemia: Secondary | ICD-10-CM

## 2020-01-01 DIAGNOSIS — I10 Essential (primary) hypertension: Secondary | ICD-10-CM

## 2020-01-01 DIAGNOSIS — R7309 Other abnormal glucose: Secondary | ICD-10-CM

## 2020-01-01 LAB — COMPREHENSIVE METABOLIC PANEL
ALT: 17 U/L (ref 0–35)
AST: 16 U/L (ref 0–37)
Albumin: 4.4 g/dL (ref 3.5–5.2)
Alkaline Phosphatase: 60 U/L (ref 39–117)
BUN: 13 mg/dL (ref 6–23)
CO2: 31 mEq/L (ref 19–32)
Calcium: 9.4 mg/dL (ref 8.4–10.5)
Chloride: 99 mEq/L (ref 96–112)
Creatinine, Ser: 0.78 mg/dL (ref 0.40–1.20)
GFR: 83.36 mL/min (ref >60.00)
Glucose, Bld: 84 mg/dL (ref 70–99)
Potassium: 3.5 mEq/L (ref 3.5–5.1)
Sodium: 137 mEq/L (ref 135–145)
Total Bilirubin: 0.5 mg/dL (ref 0.2–1.2)
Total Protein: 7 g/dL (ref 6.0–8.3)

## 2020-01-01 LAB — LIPID PANEL
Cholesterol: 128 mg/dL (ref 0–200)
HDL: 30.6 mg/dL — ABNORMAL LOW (ref >39.00)
LDL Cholesterol: 81 mg/dL (ref 0–99)
NonHDL: 97.15
Total CHOL/HDL Ratio: 4
Triglycerides: 79 mg/dL (ref 0.0–149.0)
VLDL: 15.8 mg/dL (ref 0.0–40.0)

## 2020-01-01 LAB — HEMOGLOBIN A1C: Hgb A1c MFr Bld: 5.7 % (ref 4.6–6.5)

## 2020-01-01 NOTE — Progress Notes (Signed)
Labs entered for follow up

## 2020-02-27 ENCOUNTER — Encounter: Payer: Self-pay | Admitting: Family Medicine

## 2020-02-29 ENCOUNTER — Other Ambulatory Visit: Payer: Self-pay | Admitting: Family Medicine

## 2020-02-29 MED ORDER — SIMVASTATIN 10 MG PO TABS: 10.0000 mg | ORAL_TABLET | Freq: Every day | ORAL | 3 refills | Status: DC

## 2020-02-29 MED ORDER — FLUOXETINE HCL 10 MG PO CAPS: 10.0000 mg | ORAL_CAPSULE | Freq: Every day | ORAL | 1 refills | Status: DC

## 2020-03-04 ENCOUNTER — Other Ambulatory Visit: Payer: Self-pay | Admitting: Family Medicine

## 2020-03-04 DIAGNOSIS — F5104 Psychophysiologic insomnia: Secondary | ICD-10-CM

## 2020-03-04 MED ORDER — TRAZODONE HCL 50 MG PO TABS: 25.0000 mg | ORAL_TABLET | Freq: Every evening | ORAL | 1 refills | Status: DC | PRN

## 2020-07-20 DIAGNOSIS — Z1371 Encounter for nonprocreative screening for genetic disease carrier status: Secondary | ICD-10-CM

## 2020-07-20 DIAGNOSIS — Z9189 Other specified personal risk factors, not elsewhere classified: Secondary | ICD-10-CM

## 2020-07-20 HISTORY — DX: Other specified personal risk factors, not elsewhere classified: Z91.89

## 2020-07-20 HISTORY — DX: Encounter for nonprocreative screening for genetic disease carrier status: Z13.71

## 2020-08-03 ENCOUNTER — Ambulatory Visit (INDEPENDENT_AMBULATORY_CARE_PROVIDER_SITE_OTHER): Payer: No Typology Code available for payment source | Admitting: Obstetrics & Gynecology

## 2020-08-03 ENCOUNTER — Other Ambulatory Visit: Payer: Self-pay

## 2020-08-03 ENCOUNTER — Other Ambulatory Visit (HOSPITAL_COMMUNITY)
Admission: RE | Admit: 2020-08-03 | Discharge: 2020-08-03 | Disposition: A | Discharge status: Home / Self Care | Payer: No Typology Code available for payment source | Source: Ambulatory Visit | Attending: Obstetrics & Gynecology | Admitting: Obstetrics & Gynecology

## 2020-08-03 ENCOUNTER — Encounter: Payer: Self-pay | Admitting: Obstetrics & Gynecology

## 2020-08-03 ENCOUNTER — Other Ambulatory Visit: Payer: Self-pay | Admitting: Obstetrics & Gynecology

## 2020-08-03 VITALS — BP 120/80 | Ht 63.5 in | Wt 143.0 lb

## 2020-08-03 DIAGNOSIS — Z803 Family history of malignant neoplasm of breast: Secondary | ICD-10-CM

## 2020-08-03 DIAGNOSIS — Z124 Encounter for screening for malignant neoplasm of cervix: Secondary | ICD-10-CM | POA: Diagnosis present

## 2020-08-03 DIAGNOSIS — Z1329 Encounter for screening for other suspected endocrine disorder: Secondary | ICD-10-CM

## 2020-08-03 DIAGNOSIS — Z1321 Encounter for screening for nutritional disorder: Secondary | ICD-10-CM

## 2020-08-03 DIAGNOSIS — L900 Lichen sclerosus et atrophicus: Secondary | ICD-10-CM

## 2020-08-03 DIAGNOSIS — Z01419 Encounter for gynecological examination (general) (routine) without abnormal findings: Secondary | ICD-10-CM

## 2020-08-03 DIAGNOSIS — Z131 Encounter for screening for diabetes mellitus: Secondary | ICD-10-CM

## 2020-08-03 DIAGNOSIS — Z1231 Encounter for screening mammogram for malignant neoplasm of breast: Secondary | ICD-10-CM

## 2020-08-03 DIAGNOSIS — Z13220 Encounter for screening for lipoid disorders: Secondary | ICD-10-CM

## 2020-08-03 DIAGNOSIS — Z202 Contact with and (suspected) exposure to infections with a predominantly sexual mode of transmission: Secondary | ICD-10-CM

## 2020-08-03 MED ORDER — CLOBETASOL PROPIONATE 0.05 % EX OINT: TOPICAL_OINTMENT | CUTANEOUS | 1 refills | Status: DC

## 2020-08-03 NOTE — Progress Notes (Signed)
HPI:      [name] is a 39 y.o. [id number] who LMP was No LMP recorded. Patient has had an ablation., she presents today for her annual examination. The patient has no complaints today.  She has prior dx of lichen sclerosus w only occas sx's, uses Clobetasol which helps.  Has lost 70 lbs this year, to try to help w health, and has been able to stop DM meds.    The patient is sexually active.  New partner (now divorced, wants to be screened. Her last pap: approximate date 2020 and was abnormal: POS HPV only; and last mammogram: approximate date 2020 (abn screen, normal diagnostic, recommendations for annual checks based on this and on FH) and was normal. The patient does perform self breast exams.  There is notable family history of breast or ovarian cancer in her family.  The patient has regular exercise: yes.  The patient denies current symptoms of depression.    GYN History: Contraception: tubal ligation  S/p Ablation 2017.  Light monthly periods, about 2 days; cramps  PMHx: Past Medical History:  Diagnosis Date  . Anxiety   . Back pain   . Constipation   . GERD (gastroesophageal reflux disease)   . Headache   . High cholesterol   . Hypertension   . Prediabetes    Past Surgical History:  Procedure Laterality Date  . HYSTEROSCOPY WITH NOVASURE N/A 01/05/2016   Procedure: HYSTEROSCOPY WITH NOVASURE;  Surgeon: Honor Loh Ward, MD;  Location: [facility] ORS;  Service: Gynecology;  Laterality: N/A;  . LAPAROSCOPIC TUBAL LIGATION Bilateral 01/05/2016   Procedure: bilateral laparoscopic salpingectomy, endometrial abalation;  Surgeon: Honor Loh Ward, MD;  Location: [facility] ORS;  Service: Gynecology;  Laterality: Bilateral;  . TONSILLECTOMY     Family History  Problem Relation Age of Onset  . Cancer Mother 36       breast CA, now with metastatic cancer  . Depression Mother   . Anxiety disorder Mother   . Breast cancer Mother 74  . Heart disease Father   . Hyperlipidemia Father   .  Hypertension Father   . Sudden death Father   . Hypertension Maternal Grandmother   . Kidney disease Maternal Grandmother    Social History   Tobacco Use  . Smoking status: Never Smoker  . Smokeless tobacco: Never Used  Vaping Use  . Vaping Use: Never used  Substance Use Topics  . Alcohol use: Yes    Comment: 1-2 glasses wine on weekend nights  . Drug use: No    Current Outpatient Medications:  .  FLUoxetine (PROZAC) 10 MG capsule, Take 1 capsule (10 mg total) by mouth daily., Disp: 90 capsule, Rfl: 1 .  simvastatin (ZOCOR) 10 MG tablet, Take 1 tablet (10 mg total) by mouth at bedtime., Disp: 90 tablet, Rfl: 3 .  clobetasol ointment (TEMOVATE) 0.05 %, Apply to affected area every night as needed, Disp: 30 g, Rfl: 1 Allergies: Augmentin [amoxicillin-pot clavulanate]  Review of Systems  Constitutional: Positive for weight loss. Negative for chills, fever and malaise/fatigue.  HENT: Negative for congestion, sinus pain and sore throat.   Eyes: Negative for blurred vision and pain.  Respiratory: Negative for cough and wheezing.   Cardiovascular: Negative for chest pain and leg swelling.  Gastrointestinal: Negative for abdominal pain, constipation, diarrhea, heartburn, nausea and vomiting.  Genitourinary: Negative for dysuria, frequency, hematuria and urgency.  Musculoskeletal: Negative for back pain, joint pain, myalgias and neck pain.  Skin: Negative for  itching and rash.  Neurological: Negative for dizziness, tremors and weakness.  Endo/Heme/Allergies: Does not bruise/bleed easily.  Psychiatric/Behavioral: Negative for depression. The patient is not nervous/anxious and does not have insomnia.     Objective: BP 120/80   Ht 5' 3.5" (1.613 m)   Wt 143 lb (64.9 kg)   BMI 24.93 kg/m   Filed Weights   08/03/20 0758  Weight: 143 lb (64.9 kg)   Body mass index is 24.93 kg/m. Physical Exam Constitutional:      General: She is not in acute distress.    Appearance: She is  well-developed.  Genitourinary:     Pelvic exam was performed with patient supine.     Vagina, uterus and rectum normal.     No lesions in the vagina.     No vaginal bleeding.     No cervical motion tenderness, friability, lesion or polyp.     Uterus is mobile.     Uterus is not enlarged.     No uterine mass detected.    Uterus is midaxial.     No right or left adnexal mass present.     Right adnexa not tender.     Left adnexa not tender.  HENT:     Head: Normocephalic and atraumatic. No laceration.     Right Ear: Hearing normal.     Left Ear: Hearing normal.     Mouth/Throat:     Pharynx: Uvula midline.  Eyes:     Pupils: Pupils are equal, round, and reactive to light.  Neck:     Thyroid: No thyromegaly.  Cardiovascular:     Rate and Rhythm: Normal rate and regular rhythm.     Heart sounds: No murmur heard.  No friction rub. No gallop.   Pulmonary:     Effort: Pulmonary effort is normal. No respiratory distress.     Breath sounds: Normal breath sounds. No wheezing.  Chest:     Breasts:        Right: No mass, skin change or tenderness.        Left: No mass, skin change or tenderness.  Abdominal:     General: Bowel sounds are normal. There is no distension.     Palpations: Abdomen is soft.     Tenderness: There is no abdominal tenderness. There is no rebound.  Musculoskeletal:        General: Normal range of motion.     Cervical back: Normal range of motion and neck supple.  Neurological:     Mental Status: She is alert and oriented to person, place, and time.     Cranial Nerves: No cranial nerve deficit.  Skin:    General: Skin is warm and dry.  Psychiatric:        Judgment: Judgment normal.  Vitals reviewed.     Assessment:  ANNUAL EXAM 1. Women's annual routine gynecological examination   2. Encounter for screening mammogram for malignant neoplasm of breast   3. Screening for cervical cancer   4. STD exposure   5. Screening for cholesterol level   6.  Screening for thyroid disorder   7. Encounter for vitamin deficiency screening   8. Screening for diabetes mellitus   9. Family history of breast cancer   10. Lichen sclerosus      Screening Plan:            1.  Cervical Screening-  Pap smear done today  2. Breast screening- Exam annually and mammogram>40 planned  3. Colonoscopy every 10 years, Hemoccult testing - after age 71  4. Labs Ordered today  5. Counseling for contraception: bilateral tubal ligation   6. STD exposure risk - PAP and GC/Chl testing - RPR+HSVIgM+HBsAg+HSV2(IgG)+...   7. Family history of breast cancer - Genetic Screening -  She presents with a significant personal and/or family history of breast cancer mother (age 67 dx). Details of which can be found in her medical/family history. She does not have a previously identified BRCA and Lynch syndrome mutation in her family. Due to her personal and/or family history of cancer she is a candidate for the [facility] test(s).    Risk for cancer, genetic susceptibility discussed.  Patient has requested gene testing.  Discussed BRCA as well as Lynch syndrome and other cancer risk assessments available based on her family history and personal history. Pros and cons of testing discussed.    8. Lichen sclerosus - clobetasol ointment (TEMOVATE) 0.05 %; Apply to affected area every night as needed  Dispense: 30 g; Refill: 1      F/U  Return in about 1 year (around 08/03/2021) for Annual.  Barnett Applebaum, MD, [facility], [facility] 08/03/2020  8:28 AM

## 2020-08-03 NOTE — Patient Instructions (Addendum)
PAP every three years Mammogram every year    Call [phone] to schedule at [facility] yearly (with PCP)  Thank you for choosing [facility]. As part of our ongoing efforts to improve patient experience, we would appreciate your feedback. Please fill out the short survey that you will receive by mail or MyChart. Your opinion is important to [place]! - Dr. Baxter Flattery Panel Testing for Cancer  What is cancer? Normal cells in the body grow, divide, and are replaced on a routine basis. Sometimes, cells divide abnormally and begin to grow out of control. These cells may form growths or tumors. Tumors can be benign (not cancer) or malignant (cancer). Benign tumors do not spread to other body tissues. Cancer tumors can invade and destroy nearby healthy tissues, bones, and organs. Cancer cells also can spread to other parts of the body and form new cancerous areas.  What causes cancer? Cancer is caused by many different factors. A few types of cancer are caused by changes in genes that can be passed from parent to child. Changes in genes are called mutations. Certain gene mutations are associated with family cancer syndromes. What are family cancer syndromes?  Family cancer syndromes are genetic conditions that increase the risk of certain types of cancer. They also are called hereditary or inherited cancer syndromes. Common family cancer syndromes include hereditary breast and ovarian cancer (HBOC) syndrome, Lynch syndrome, Li-Fraumeni syndrome, Cowden syndrome, and Peutz-Jeghers syndrome. What is genetic testing for cancer? Genetic testing for cancer looks for mutations in certain genes that are known to be linked to cancer. The results can help determine your risk of developing a disease like cancer or passing on a genetic disorder. What is multigene panel testing? Multigene panel testing is a type of genetic testing that looks for mutations in several genes at once. This is different from  single-gene testing, which looks for a mutation in a specific gene. Single-gene testing is often used when there is already a known gene mutation in a family. For example, testing for BRCA mutations only looks for changes in BRCA1 and BRCA2 genes. Who should have genetic testing? You may consider genetic testing if your personal or family history shows that you have an increased risk of cancer. Your obstetrician-gynecologist (ob-gyn) or other health care professional may ask you these and other questions: . Have you or any family members been diagnosed with cancer?  . If yes, which family members were diagnosed, with what types of cancer, and at what ages?  . Were you or any of your family members born with birth defects?  Marland Kitchen Are you of [place] or [place] Jewish ancestry? Depending on your answers, your ob-gyn or other health care professional may suggest that you talk about genetic testing with a genetic counselor or a physician who is an expert in genetics. You can choose to have genetic testing, or you can choose not to. Before you decide, you should have genetic counseling. What is genetic counseling? In genetic counseling, you will talk with a genetic counselor or physician expert about the following: . Your risk of getting a hereditary type of cancer  . Who in your family could potentially get tested  . How testing is done  . What the test results may mean  . What you may do depending on the results How is genetic testing done? Genetic testing typically is done from a blood sample or saliva sample. When is multigene panel testing recommended? Multigene panel testing may  be useful if you . are at risk of a family cancer syndrome that has more than one gene associated with it  . have a personal or family history of cancer and single-gene testing has not found a mutation, or the result is uncertain What are the benefits of multigene panel testing? Multigene panel testing looks at  multiple genes with one test. If a gene mutation is found, multigene panel testing may . give you a better understanding of your cancer risk than single-gene testing  . help your health care team decide what cancer screenings you might need beyond routine screenings  . help you think about what you can do to prevent cancer What are the risks of multigene panel testing? The risks of multigene panel testing may include the following: . Results can be complicated to interpret.  . Testing may find gene mutations that show a moderate or uncertain risk of cancer.  . It may be hard to know what you should do with your test results. You should talk with a genetic counselor or physician expert before and after genetic testing to learn what the results mean. If I have a gene mutation, should I tell my family? Having a gene mutation means you can pass the mutation to your children. Your siblings also may have the gene mutation. Although you do not have to tell your family members, sharing the information could be life-saving for them. With this information, your family members can decide whether to be tested and get cancer screenings at an early age. How can I prevent cancer if I test positive for a gene mutation? If you test positive for a gene mutation, you can discuss cancer screening and prevention options with your ob-gyn, genetic counselor, or other health care professional. It may be helpful to have earlier or more frequent cancer screening tests, which can find cancer at an early and more curable stage. Risk reduction steps like medication, surgery, and lifestyle changes also may be recommended. I'm concerned about discrimination based on genetic testing results. What should I know? Many people are concerned about possible employment discrimination or denial of insurance coverage based on genetic testing results. The Genetic Information Nondiscrimination Act of 2008 (GINA) makes it illegal for health  insurers to require genetic testing results or use results to make decisions about coverage, rates, or preexisting conditions. GINA also makes it illegal for employers to discriminate against employees or applicants because of genetic information. GINA does not apply to life insurance, long-term care insurance, or disability insurance. What should I know about direct-to-consumer genetic tests? Direct-to-consumer (or at-home) genetic tests are sold over the internet. You do not need a doctor's order to get one. The [employer]st[employer]ians [employer] Gynec[employer] discourages use of direct-to-consumer genetic tests because the results may be misleading. For example, one commercial test for BRCA mutations only looks for three mutations, even though there are more than 500 BRCA mutations linked to cancer. The test results could cause unnecessary fear, or a false sense that you are not at risk. You should see a health care professional if you want a genetic test.  Glossary BRCA1 and BRCA2: Genes that keep cells from growing too rapidly. Changes in these genes have been linked to an increased risk of breast cancer and ovarian cancer.  Cowden Syndrome: A genetic condition that increases a person's risk of cancer of the breast, thyroid, uterus, colon, kidney, and skin. Genes: Segments of DNA that contain instructions for the development of a  person's physical traits and control of the processes in the body. The gene is the basic unit of heredity and can be passed from parent to child. Genetic Counselor: A health care professional with special training in genetics who can provide expert advice about genetic disorders and prenatal testing. Hereditary Breast and Ovarian Cancer (HBOC) Syndrome: A genetic condition that increases a person's risk of cancer of the breast, ovary, prostate, pancreas, and skin (melanoma). Li-Fraumeni Syndrome: A genetic condition that increases a person's risk of cancer of the breast,  bones, soft tissue, brain, and outer layer of the adrenal glands. Lynch Syndrome: A genetic condition that increases a person's risk of cancer of the colon, rectum, ovary, uterus, pancreas, and bile duct. Multigene Panel Testing: A type of genetic test that can look for mutations in multiple genes at once. Mutations: Changes in genes that can be passed from parent to child. Obstetrician-Gynecologist (Ob-Gyn): A doctor with special training and education in women's health. Peutz-Jeghers Syndrome: A genetic condition that increases a person's risk of cancer of the stomach, intestines, pancreas, cervix, ovary, and breast.

## 2020-08-05 LAB — LIPID PANEL
Chol/HDL Ratio: 3.6 ratio (ref 0.0–4.4)
Cholesterol, Total: 165 mg/dL (ref 100–199)
HDL: 46 mg/dL (ref >39)
LDL Chol Calc (NIH): 107 mg/dL — ABNORMAL HIGH (ref 0–99)
Triglycerides: 58 mg/dL (ref 0–149)
VLDL Cholesterol Cal: 12 mg/dL (ref 5–40)

## 2020-08-05 LAB — HEMOGLOBIN A1C
Est. average glucose Bld gHb Est-mCnc: 105 mg/dL
Hgb A1c MFr Bld: 5.3 % (ref 4.8–5.6)

## 2020-08-05 LAB — CYTOLOGY - PAP
Chlamydia: NEGATIVE
Comment: NEGATIVE
Comment: NEGATIVE
Comment: NEGATIVE
Comment: NORMAL
Diagnosis: NEGATIVE
Diagnosis: REACTIVE
High risk HPV: NEGATIVE
Neisseria Gonorrhea: NEGATIVE
Trichomonas: NEGATIVE

## 2020-08-05 LAB — TSH: TSH: 0.46 u[IU]/mL (ref 0.450–4.500)

## 2020-08-05 LAB — RPR+HSVIGM+HBSAG+HSV2(IGG)+...
HIV Screen 4th Generation wRfx: NONREACTIVE
HSV 2 IgG, Type Spec: <0.91 index (ref 0.00–0.90)
HSVI/II Comb IgM: 1.12 Ratio — ABNORMAL HIGH (ref 0.00–0.90)
Hepatitis B Surface Ag: NEGATIVE
RPR Ser Ql: NONREACTIVE

## 2020-08-05 LAB — VITAMIN D 25 HYDROXY (VIT D DEFICIENCY, FRACTURES): Vit D, 25-Hydroxy: 97.2 ng/mL (ref 30.0–100.0)

## 2020-08-16 ENCOUNTER — Telehealth: Payer: Self-pay | Admitting: Obstetrics & Gynecology

## 2020-08-16 NOTE — Telephone Encounter (Signed)
Called and left voicemail for patient to call back to be scheduled. 

## 2020-08-16 NOTE — Telephone Encounter (Signed)
Patient is scheduled for 08/23/2020 at 4:30 with [facility]

## 2020-08-16 NOTE — Telephone Encounter (Signed)
-----   Message from Nadara Mustard, MD sent at 08/16/2020  2:11 PM EDT ----- Regarding: [facility] Virtual f/u appt [facility] results review

## 2020-08-17 ENCOUNTER — Encounter: Payer: Self-pay | Admitting: Obstetrics and Gynecology

## 2020-08-23 ENCOUNTER — Telehealth (INDEPENDENT_AMBULATORY_CARE_PROVIDER_SITE_OTHER): Payer: No Typology Code available for payment source | Admitting: Obstetrics & Gynecology

## 2020-08-23 ENCOUNTER — Other Ambulatory Visit: Payer: Self-pay

## 2020-08-23 ENCOUNTER — Encounter: Payer: Self-pay | Admitting: Obstetrics & Gynecology

## 2020-08-23 DIAGNOSIS — Z803 Family history of malignant neoplasm of breast: Secondary | ICD-10-CM

## 2020-08-23 NOTE — Progress Notes (Signed)
Virtual Visit via Video Note  I connected with Felicia Mcdowell on 08/23/20 at  4:30 PM EDT by a video enabled telemedicine application and verified that I am speaking with the correct person using two identifiers.  Location: Patient: Home Provider: Office   I discussed the limitations of evaluation and management by telemedicine and the availability of in person appointments. The patient expressed understanding and agreed to proceed.  History of Present Illness:  Felicia Mcdowell is a 39 y.o. who recently underwent labwork for gene cancer risk.  She presents for discussion of results and plan of management. Results revealed neg results and TC ratio 18 %.Marland Kitchen  PMHx: She  has a past medical history of Anxiety, Back pain, BRCA negative (07/2020), Constipation, Family history of breast cancer, GERD (gastroesophageal reflux disease), Headache, High cholesterol, Hypertension, Increased risk of breast cancer (07/2020), and Prediabetes. Also,  has a past surgical history that includes Tonsillectomy; Laparoscopic tubal ligation (Bilateral, 01/05/2016); and Hysteroscopy with novasure (N/A, 01/05/2016)., family history includes Anxiety disorder in her mother; Breast cancer (age of onset: 77) in her mother; Cancer (age of onset: 17) in her mother; Depression in her mother; Heart disease in her father; Hyperlipidemia in her father; Hypertension in her father and maternal grandmother; Kidney disease in her maternal grandmother; Sudden death in her father.,  reports that she has never smoked. She has never used smokeless tobacco. She reports current alcohol use. She reports that she does not use drugs. No outpatient medications have been marked as taking for the 08/23/20 encounter (Video Visit) with Gae Dry, MD.  . Also, is allergic to augmentin [amoxicillin-pot clavulanate]..  Review of Systems  All other systems reviewed and are negative.    Observations/Objective: No exam today, due to telephone eVisit  due to [facility] virus restriction on elective visits and procedures.  Prior visits reviewed along with ultrasounds/labs as indicated.  Assessment and Plan: 1. Family history of breast cancer  Follow Up Instructions: Follow up discussion regarding recent genetic testing due to familial high risk factors.  Her lab genetic panel was negative.  Reassurance provided and counseling as to the pros and cons of testing was done today.  Since the current test is not perfect, it is possible that there may be a gene mutation that current testing cannot detect, but that chance is small. It is possible that a different genetic factor, which has not yet been discovered or is not on this panel, is responsible for the cancer diagnoses in the family. Again, the likelihood of this is low.   Most cancers happen by chance and this test, along with details of her family history, suggests that her cancer falls into this category. She is recommended to follow the cancer screening guidelines provided by her physician.  Breast cancer risk is further calculated based on the T-C model.  Even with negative gene testing, she still has a risk of 18% based on this calculation.  Recommendations are for yearly breast exams, mammograms, and MRI when this risk is > 20%.     I discussed the assessment and treatment plan with the patient. The patient was provided an opportunity to ask questions and all were answered. The patient agreed with the plan and demonstrated an understanding of the instructions.   The patient was advised to call back or seek an in-person evaluation if the symptoms worsen or if the condition fails to improve as anticipated.  A total of 20 minutes were spent face-to-face with the patient  as well as preparation, review, communication, and documentation during this encounter.   Barnett Applebaum, MD, [facility], [facility] 08/23/2020  4:34 PM

## 2020-09-14 ENCOUNTER — Other Ambulatory Visit: Payer: Self-pay

## 2020-09-14 ENCOUNTER — Ambulatory Visit
Admission: RE | Admit: 2020-09-14 | Discharge: 2020-09-14 | Disposition: A | Discharge status: Home / Self Care | Payer: No Typology Code available for payment source | Source: Ambulatory Visit | Attending: Obstetrics & Gynecology | Admitting: Obstetrics & Gynecology

## 2020-09-14 DIAGNOSIS — Z1231 Encounter for screening mammogram for malignant neoplasm of breast: Secondary | ICD-10-CM | POA: Diagnosis present

## 2020-10-20 ENCOUNTER — Other Ambulatory Visit: Payer: Self-pay | Admitting: Family Medicine

## 2020-10-27 ENCOUNTER — Telehealth: Payer: Self-pay | Admitting: Primary Care

## 2020-10-27 DIAGNOSIS — N898 Other specified noninflammatory disorders of vagina: Secondary | ICD-10-CM

## 2020-10-27 MED ORDER — METRONIDAZOLE 500 MG PO TABS: 500.0000 mg | ORAL_TABLET | Freq: Two times a day (BID) | ORAL | 0 refills | Status: DC

## 2020-10-27 MED ORDER — FLUCONAZOLE 150 MG PO TABS: 150.0000 mg | ORAL_TABLET | Freq: Once | ORAL | 0 refills | Status: AC

## 2020-10-27 NOTE — Telephone Encounter (Signed)
Received phone call from patient who will be transferring care to me. She cannot come in for a visit at this time, but is having vaginal symptoms of BV. Prior history of BV and yeast infections.  Since we cannot complete a wet prep, I will treat for both BV and yeast. She will be in to set up transfer of care appointment soon.

## 2021-01-04 ENCOUNTER — Other Ambulatory Visit: Payer: Self-pay

## 2021-01-04 DIAGNOSIS — E782 Mixed hyperlipidemia: Secondary | ICD-10-CM

## 2021-01-04 DIAGNOSIS — F32A Depression, unspecified: Secondary | ICD-10-CM

## 2021-01-04 DIAGNOSIS — F5104 Psychophysiologic insomnia: Secondary | ICD-10-CM

## 2021-01-04 DIAGNOSIS — F419 Anxiety disorder, unspecified: Secondary | ICD-10-CM

## 2021-01-04 NOTE — Telephone Encounter (Signed)
Felicia Mcdowell pt... requesting refills of medications

## 2021-01-05 ENCOUNTER — Other Ambulatory Visit: Payer: Self-pay | Admitting: Primary Care

## 2021-01-05 MED ORDER — TRAZODONE HCL 50 MG PO TABS: 25.0000 mg | ORAL_TABLET | Freq: Every evening | ORAL | 0 refills | Status: DC | PRN

## 2021-01-05 MED ORDER — FLUOXETINE HCL 20 MG PO CAPS: 20.0000 mg | ORAL_CAPSULE | Freq: Every day | ORAL | 0 refills | Status: DC

## 2021-01-05 MED ORDER — SIMVASTATIN 10 MG PO TABS: 10.0000 mg | ORAL_TABLET | Freq: Every day | ORAL | 0 refills | Status: DC

## 2021-01-05 NOTE — Telephone Encounter (Signed)
Refills sent to pharmacy. 

## 2021-02-22 ENCOUNTER — Ambulatory Visit: Payer: No Typology Code available for payment source | Admitting: Family Medicine

## 2021-02-22 NOTE — Progress Notes (Deleted)
    Felicia Matas T. Adryen Cookson, MD, CAQ Sports Medicine  Primary Care and Sports Medicine [facility] at [facility] [address] [place], [address]  Phone: [phone]  FAX: [phone]  Felicia Mcdowell - 40 y.o. female  MRN [id number]  Date of Birth: 1981-05-16  Date: 02/22/2021  PCP: Felicia Belfast, FNP (Inactive)  Referral: No ref. provider found  No chief complaint on file.   This visit occurred during the SARS-CoV-2 public health emergency.  Safety protocols were in place, including screening questions prior to the visit, additional usage of staff PPE, and extensive cleaning of exam room while observing appropriate contact time as indicated for disinfecting solutions.   Subjective:   Felicia Mcdowell is a 40 y.o. very pleasant female patient with There is no height or weight on file to calculate BMI. who presents with the following:  She is a very pleasant young lady, she presents with some ongoing back pain.    Review of Systems is noted in the HPI, as appropriate   Objective:   There were no vitals taken for this visit.  ***  Radiology: No results found.  Assessment and Plan:   ***

## 2021-03-18 ENCOUNTER — Telehealth: Payer: Self-pay | Admitting: Primary Care

## 2021-03-18 NOTE — Telephone Encounter (Signed)
Patient called yesterday with symptoms of increased anxiety due to personal stressors and issues with her ex-husband. She is compliant to her fluoxetine 20 mg but felt that she needed some additional help during this tough time.  I agreed to provide her with buspirone 10 mg BID PRN anxiety. We also discussed the need to follow up now that she is transferring care over to me from [facility]. She will set up an appointment within the next month.  Buspirone 10 mg was called into her pharmacy on 03/17/21, will add to med list.

## 2021-04-03 ENCOUNTER — Other Ambulatory Visit: Payer: Self-pay

## 2021-04-03 DIAGNOSIS — F419 Anxiety disorder, unspecified: Secondary | ICD-10-CM

## 2021-04-03 DIAGNOSIS — F32A Depression, unspecified: Secondary | ICD-10-CM

## 2021-04-03 DIAGNOSIS — E782 Mixed hyperlipidemia: Secondary | ICD-10-CM

## 2021-04-03 NOTE — Telephone Encounter (Signed)
Last filled 01/05/21...Marland Kitchen please advise

## 2021-04-04 ENCOUNTER — Other Ambulatory Visit: Payer: Self-pay

## 2021-04-04 MED ORDER — SIMVASTATIN 10 MG PO TABS
ORAL_TABLET | ORAL | 0 refills | Status: DC
  Filled 2021-04-04: qty 90, 90d supply, fill #0

## 2021-04-04 MED ORDER — FLUOXETINE HCL 20 MG PO CAPS
ORAL_CAPSULE | Freq: Every day | ORAL | 0 refills | Status: DC
  Filled 2021-04-04: qty 90, 90d supply, fill #0

## 2021-05-03 ENCOUNTER — Ambulatory Visit (INDEPENDENT_AMBULATORY_CARE_PROVIDER_SITE_OTHER): Payer: No Typology Code available for payment source | Admitting: Primary Care

## 2021-05-03 ENCOUNTER — Encounter: Payer: Self-pay | Admitting: Primary Care

## 2021-05-03 ENCOUNTER — Other Ambulatory Visit: Payer: Self-pay

## 2021-05-03 ENCOUNTER — Ambulatory Visit (INDEPENDENT_AMBULATORY_CARE_PROVIDER_SITE_OTHER)
Admission: RE | Admit: 2021-05-03 | Discharge: 2021-05-03 | Disposition: A | Discharge status: Home / Self Care | Payer: No Typology Code available for payment source | Source: Ambulatory Visit | Attending: Primary Care | Admitting: Primary Care

## 2021-05-03 VITALS — BP 110/68 | HR 109 | Ht 64.0 in | Wt 147.0 lb

## 2021-05-03 DIAGNOSIS — Z87898 Personal history of other specified conditions: Secondary | ICD-10-CM

## 2021-05-03 DIAGNOSIS — G8929 Other chronic pain: Secondary | ICD-10-CM

## 2021-05-03 DIAGNOSIS — M545 Low back pain, unspecified: Secondary | ICD-10-CM

## 2021-05-03 DIAGNOSIS — E782 Mixed hyperlipidemia: Secondary | ICD-10-CM | POA: Diagnosis not present

## 2021-05-03 DIAGNOSIS — F32A Depression, unspecified: Secondary | ICD-10-CM

## 2021-05-03 DIAGNOSIS — I10 Essential (primary) hypertension: Secondary | ICD-10-CM | POA: Diagnosis not present

## 2021-05-03 DIAGNOSIS — Z803 Family history of malignant neoplasm of breast: Secondary | ICD-10-CM

## 2021-05-03 DIAGNOSIS — E663 Overweight: Secondary | ICD-10-CM

## 2021-05-03 DIAGNOSIS — R7303 Prediabetes: Secondary | ICD-10-CM

## 2021-05-03 DIAGNOSIS — F419 Anxiety disorder, unspecified: Secondary | ICD-10-CM

## 2021-05-03 DIAGNOSIS — E559 Vitamin D deficiency, unspecified: Secondary | ICD-10-CM

## 2021-05-03 MED ORDER — SAXENDA 18 MG/3ML ~~LOC~~ SOPN
0.6000 mg | PEN_INJECTOR | Freq: Every day | SUBCUTANEOUS | 0 refills | Status: DC
  Filled 2021-05-03: qty 15, 30d supply, fill #0

## 2021-05-03 MED ORDER — UNIFINE PENTIPS 31G X 5 MM MISC
1 refills | Status: DC
  Filled 2021-05-03: qty 100, 90d supply, fill #0

## 2021-05-03 NOTE — Progress Notes (Signed)
Subjective:    Patient ID: Felicia Mcdowell, female    DOB: 26-Dec-1980, 40 y.o.   MRN: [id number]  HPI  Felicia Mcdowell is a very pleasant 40 y.o. female with a history of hypertension, prediabetes, hyperlipidemia, obesity who presents today to transfer care from [facility] and for follow up.  1) Chronic Back Pain: Chronic since the age of 36, symptoms include stiffness to mid lumbar spine, worse when sitting or laying for prolonged periods of time. Occasional use of ibuprofen. Symptoms are daily. She denies radiculopathy, numbness, loss of bowel/bladder control.   2) Anxiety and Depression: Doing well on fluoxetine 20 mg, Trazodone 50 mg (1/2 tab). Using Buspar PRN for anxiety. Feels well managed on current regimen.   3) Overweight: History of obesity which contributed to prediabetes and hypertension. Fortunately she's been able to lose the majority of that weight but has had difficulty in continued weight loss. She would like to try Saxenda daily to see if this helps her achieve her goals.     BP Readings from Last 3 Encounters:  05/03/21 110/68  08/03/20 120/80  08/26/19 118/80   Wt Readings from Last 3 Encounters:  05/03/21 147 lb (66.7 kg)  08/03/20 143 lb (64.9 kg)  09/02/19 205 lb (93 kg)      Review of Systems  Respiratory:  Negative for shortness of breath.   Cardiovascular:  Negative for chest pain.  Musculoskeletal:  Positive for back pain.  Neurological:  Negative for weakness and numbness.  Psychiatric/Behavioral:  The patient is not nervous/anxious.         Past Medical History:  Diagnosis Date   Anxiety    Back pain    BRCA negative 07/2020   MyRisk neg   Constipation    Family history of breast cancer    GERD (gastroesophageal reflux disease)    Headache    High cholesterol    Hypertension    Increased risk of breast cancer 07/2020   IBIS=26.5%/riskscore-18.4%   Prediabetes     Social History   Socioeconomic History   Marital status:  Divorced    Spouse name: jeff Obst   Number of children: 2   Years of education: Not on file   Highest education level: Not on file  Occupational History   Occupation: Engineer, technical sales: [employer]  Tobacco Use   Smoking status: Never   Smokeless tobacco: Never  Vaping Use   Vaping Use: Never used  Substance and Sexual Activity   Alcohol use: Yes    Comment: 1-2 glasses wine on weekend nights   Drug use: No   Sexual activity: Yes    Partners: Male    Birth control/protection: Surgical  Other Topics Concern   Not on file  Social History Narrative   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[place] [employer]   Married, two daughters   Enjoys exercising, reading         Social Determinants of Health   Financial Resource Strain: Not on file  Food Insecurity: Not on file  Transportation Needs: Not on file  Physical Activity: Not on file  Stress: Not on file  Social Connections: Not on file  Intimate Partner Violence: Not on file    Past Surgical History:  Procedure Laterality Date   HYSTEROSCOPY WITH NOVASURE N/A 01/05/2016   Procedure: HYSTEROSCOPY WITH NOVASURE;  Surgeon: Honor Loh Ward, MD;  Location: [facility] ORS;  Service: Gynecology;  Laterality: N/A;   LAPAROSCOPIC TUBAL LIGATION Bilateral 01/05/2016  Procedure: bilateral laparoscopic salpingectomy, endometrial abalation;  Surgeon: Honor Loh Ward, MD;  Location: [facility] ORS;  Service: Gynecology;  Laterality: Bilateral;   TONSILLECTOMY      Family History  Problem Relation Age of Onset   Cancer Mother 65       breast CA, now with metastatic cancer   Depression Mother    Anxiety disorder Mother    Breast cancer Mother 57   Heart disease Father    Hyperlipidemia Father    Hypertension Father    Sudden death Father    Hypertension Maternal Grandmother    Kidney disease Maternal Grandmother     Allergies  Allergen Reactions   Augmentin [Amoxicillin-Pot Clavulanate] Hives and Rash    Rash     Current Outpatient  Medications on File Prior to Visit  Medication Sig Dispense Refill   busPIRone (BUSPAR) 10 MG tablet Take 10 mg by mouth 2 (two) times daily as needed.     clobetasol ointment (TEMOVATE) 0.05 % APPLY TO AFFECTED AREA EVERY NIGHT AS NEEDED 30 g 1   FLUoxetine (PROZAC) 20 MG capsule TAKE 1 CAPSULE (20 MG TOTAL) BY MOUTH DAILY. 90 capsule 0   simvastatin (ZOCOR) 10 MG tablet TAKE 1 TABLET (10 MG TOTAL) BY MOUTH AT BEDTIME FOR CHOLESTEROL. 90 tablet 0   traZODone (DESYREL) 50 MG tablet TAKE 1/2 TO 1 TABLET (25-50 MG TOTAL) BY MOUTH AT BEDTIME AS NEEDED FOR SLEEP. 90 tablet 0   No current facility-administered medications on file prior to visit.    BP 110/68 (BP Location: Left Arm, Patient Position: Sitting, Cuff Size: Normal)   Pulse (!) 109   Ht 5' 4"  (1.626 m)   Wt 147 lb (66.7 kg)   SpO2 100%   BMI 25.23 kg/m  Objective:   Physical Exam Cardiovascular:     Rate and Rhythm: Normal rate and regular rhythm.  Pulmonary:     Effort: Pulmonary effort is normal.     Breath sounds: Normal breath sounds.  Musculoskeletal:        General: Normal range of motion.     Cervical back: Neck supple.  Skin:    General: Skin is warm and dry.          Assessment & Plan:      This visit occurred during the SARS-CoV-2 public health emergency.  Safety protocols were in place, including screening questions prior to the visit, additional usage of staff PPE, and extensive cleaning of exam room while observing appropriate contact time as indicated for disinfecting solutions.

## 2021-05-03 NOTE — Assessment & Plan Note (Signed)
Resolved with weight loss, previously on losartan and HCTZ. Continue off medications.  BP well controlled today.

## 2021-05-03 NOTE — Assessment & Plan Note (Signed)
Compliant to simvastatin 20 mg, continue same. Lipid panel pending.

## 2021-05-03 NOTE — Assessment & Plan Note (Signed)
Well controlled. See labs from 2021.

## 2021-05-03 NOTE — Assessment & Plan Note (Signed)
Commended her on weight loss, agree to a trial of Saxenda so she can achieve her weight loss goals. Rx sent to pharmacy, start with 0.6 mg daily.

## 2021-05-03 NOTE — Assessment & Plan Note (Signed)
Resolved with weight loss. Repeat A1C pending.

## 2021-05-03 NOTE — Assessment & Plan Note (Signed)
FH in mother who had breast cancer twice. Compliant to annual mammograms. Due in October 2022. Follows with GYN.

## 2021-05-03 NOTE — Assessment & Plan Note (Signed)
Well controlled on fluoxetine 20 mg daily, Trazodone 25 mg HS, and PRN Buspar 10 mg. Continue same.

## 2021-05-03 NOTE — Assessment & Plan Note (Signed)
Chronic for years, no trauma. No alarm signs Offered PT for which she kindly declines.  Checking plain films of lumbar spine today.

## 2021-05-04 LAB — COMPREHENSIVE METABOLIC PANEL
ALT: 10 U/L (ref 0–35)
AST: 12 U/L (ref 0–37)
Albumin: 4.7 g/dL (ref 3.5–5.2)
Alkaline Phosphatase: 60 U/L (ref 39–117)
BUN: 16 mg/dL (ref 6–23)
CO2: 30 mEq/L (ref 19–32)
Calcium: 9.5 mg/dL (ref 8.4–10.5)
Chloride: 105 mEq/L (ref 96–112)
Creatinine, Ser: 1.14 mg/dL (ref 0.40–1.20)
GFR: 61.39 mL/min (ref >60.00)
Glucose, Bld: 66 mg/dL — ABNORMAL LOW (ref 70–99)
Potassium: 4.4 mEq/L (ref 3.5–5.1)
Sodium: 141 mEq/L (ref 135–145)
Total Bilirubin: 0.6 mg/dL (ref 0.2–1.2)
Total Protein: 7.2 g/dL (ref 6.0–8.3)

## 2021-05-04 LAB — CBC
HCT: 41.1 % (ref 36.0–46.0)
Hemoglobin: 13.8 g/dL (ref 12.0–15.0)
MCHC: 33.7 g/dL (ref 30.0–36.0)
MCV: 91.6 fl (ref 78.0–100.0)
Platelets: 255 10*3/uL (ref 150.0–400.0)
RBC: 4.49 Mil/uL (ref 3.87–5.11)
RDW: 13.1 % (ref 11.5–15.5)
WBC: 11.2 10*3/uL — ABNORMAL HIGH (ref 4.0–10.5)

## 2021-05-04 LAB — LIPID PANEL
Cholesterol: 139 mg/dL (ref 0–200)
HDL: 50.7 mg/dL (ref >39.00)
LDL Cholesterol: 77 mg/dL (ref 0–99)
NonHDL: 87.83
Total CHOL/HDL Ratio: 3
Triglycerides: 55 mg/dL (ref 0.0–149.0)
VLDL: 11 mg/dL (ref 0.0–40.0)

## 2021-05-04 LAB — TSH: TSH: 0.6 u[IU]/mL (ref 0.35–4.50)

## 2021-05-04 LAB — HEMOGLOBIN A1C: Hgb A1c MFr Bld: 5.1 % (ref 4.6–6.5)

## 2021-05-05 ENCOUNTER — Other Ambulatory Visit: Payer: Self-pay

## 2021-05-08 ENCOUNTER — Other Ambulatory Visit: Payer: Self-pay

## 2021-05-10 ENCOUNTER — Telehealth: Payer: Self-pay

## 2021-05-10 NOTE — Telephone Encounter (Signed)
CoverMyMeds has established a business relationship with the Pharmacologist that results in a differentiated user experience on CoverMyMeds for the PA process, and may include patient support services. There may be lower cost medications available or preferred on your patient's plan. Azarah Dacy (KeyFranky Macho) Rx #: [id number] Saxenda 18MG pen-injectors   Form MedImpact ePA Form 2017 NCPDP Created 7 days ago Sent to Plan 2 minutes ago Plan Response 2 minutes ago Submit Clinical Questions less than a minute ago Determination Wait for Determination Please wait for MedImpact 2017 to return a determination.

## 2021-06-21 DIAGNOSIS — F40243 Fear of flying: Secondary | ICD-10-CM

## 2021-06-22 ENCOUNTER — Other Ambulatory Visit: Payer: Self-pay

## 2021-06-22 MED ORDER — LORAZEPAM 0.5 MG PO TABS
ORAL_TABLET | ORAL | 0 refills | Status: DC
  Filled 2021-06-22: qty 10, 10d supply, fill #0

## 2021-06-27 ENCOUNTER — Ambulatory Visit: Payer: No Typology Code available for payment source | Admitting: Primary Care

## 2021-07-19 ENCOUNTER — Other Ambulatory Visit: Payer: Self-pay | Admitting: Primary Care

## 2021-07-19 ENCOUNTER — Other Ambulatory Visit: Payer: Self-pay

## 2021-07-19 DIAGNOSIS — E782 Mixed hyperlipidemia: Secondary | ICD-10-CM

## 2021-07-19 DIAGNOSIS — F419 Anxiety disorder, unspecified: Secondary | ICD-10-CM

## 2021-07-19 DIAGNOSIS — F32A Depression, unspecified: Secondary | ICD-10-CM

## 2021-07-19 MED ORDER — SIMVASTATIN 10 MG PO TABS
ORAL_TABLET | ORAL | 3 refills | Status: DC
  Filled 2021-07-19: qty 90, 90d supply, fill #0
  Filled 2021-10-17: qty 90, 90d supply, fill #1

## 2021-07-19 MED ORDER — FLUOXETINE HCL 20 MG PO CAPS
ORAL_CAPSULE | Freq: Every day | ORAL | 3 refills | Status: DC
  Filled 2021-07-19: qty 90, 90d supply, fill #0
  Filled 2021-10-17: qty 90, 90d supply, fill #1

## 2021-08-14 DIAGNOSIS — Z720 Tobacco use: Secondary | ICD-10-CM

## 2021-08-14 NOTE — Telephone Encounter (Signed)
Let patient know you are out of the office today and will look at when you are back.

## 2021-08-15 ENCOUNTER — Other Ambulatory Visit: Payer: Self-pay

## 2021-08-15 MED ORDER — BUPROPION HCL ER (SR) 150 MG PO TB12
150.0000 mg | ORAL_TABLET | Freq: Two times a day (BID) | ORAL | 0 refills | Status: DC
  Filled 2021-08-15: qty 6, 3d supply, fill #0
  Filled 2021-08-15: qty 174, 87d supply, fill #0

## 2021-08-16 ENCOUNTER — Other Ambulatory Visit: Payer: Self-pay

## 2021-08-22 ENCOUNTER — Other Ambulatory Visit: Payer: Self-pay | Admitting: Primary Care

## 2021-08-22 ENCOUNTER — Telehealth (INDEPENDENT_AMBULATORY_CARE_PROVIDER_SITE_OTHER): Payer: No Typology Code available for payment source | Admitting: Primary Care

## 2021-08-22 ENCOUNTER — Encounter: Payer: Self-pay | Admitting: Primary Care

## 2021-08-22 ENCOUNTER — Other Ambulatory Visit: Payer: Self-pay

## 2021-08-22 VITALS — Ht 64.0 in | Wt 147.0 lb

## 2021-08-22 DIAGNOSIS — G9389 Other specified disorders of brain: Secondary | ICD-10-CM

## 2021-08-22 HISTORY — DX: Other specified disorders of brain: G93.89

## 2021-08-22 NOTE — Patient Instructions (Signed)
You will be contacted regarding your referral to vascular services and for the CT scan.  Please let us know if you have not been contacted within a few days.  It was a pleasure to see you today!

## 2021-08-22 NOTE — Progress Notes (Signed)
Patient ID: Felicia Mcdowell, female    DOB: 12/22/1980, 40 y.o.   MRN: [id number]  Virtual visit completed through [employer], a video enabled telemedicine application. Due to national recommendations of social distancing due to COVID-19, a virtual visit is felt to be most appropriate for this patient at this time. Reviewed limitations, risks, security and privacy concerns of performing a virtual visit and the availability of in person appointments. I also reviewed that there may be a patient responsible charge related to this service. The patient agreed to proceed.   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    Ht 5\' 4"  (1.626 m)   Wt 147 lb (66.7 kg)   BMI 25.23 kg/m    CC: Pulsatile mass Subjective:   HPI: Felicia Mcdowell is a 40 y.o. female presenting on 08/22/2021 for post traumatic pulsatile mass.   She first noticed the mass several days after hitting her head on her boyfriends truck while attempting to get out of her car. This occurred in late August 2022.  Since then the mass as remained. The mass is pulsatile, will reduce when laying down, will increase with being upright.   Over the last week she's noticed right sided temporal headaches. She denies dizziness, visual changes, bruising.       Relevant past medical, surgical, family and social history reviewed and updated as indicated. Interim medical history since our last visit reviewed. Allergies and medications reviewed and updated. Outpatient Medications Prior to Visit  Medication Sig Dispense Refill   buPROPion (WELLBUTRIN SR) 150 MG 12 hr tablet Take 1 tablet (150 mg total) by mouth 2 (two) times daily. For smoking cessation. 180 tablet 0   FLUoxetine (PROZAC) 20 MG capsule TAKE 1 CAPSULE (20 MG TOTAL) BY MOUTH DAILY. 90 capsule 3    LORazepam (ATIVAN) 0.5 MG tablet Take 1 tablet by mouth 30-60 minutes prior to flights. 10 tablet 0   simvastatin (ZOCOR) 10 MG tablet TAKE 1 TABLET (10 MG TOTAL) BY MOUTH AT BEDTIME FOR CHOLESTEROL. 90 tablet 3   traZODone (DESYREL) 50 MG tablet TAKE 1/2 TO 1 TABLET (25-50 MG TOTAL) BY MOUTH AT BEDTIME AS NEEDED FOR SLEEP. 90 tablet 0   busPIRone (BUSPAR) 10 MG tablet Take 10 mg by mouth 2 (two) times daily as needed. (Patient not taking: Reported on 08/22/2021)     No facility-administered medications prior to visit.     Per HPI unless specifically indicated in ROS section below Review of Systems Objective:  Ht 5\' 4"  (1.626 m)   Wt 147 lb (66.7 kg)   BMI 25.23 kg/m   Wt Readings from Last 3 Encounters:  08/22/21 147 lb (66.7 kg)  05/03/21 147 lb (66.7 kg)  08/03/20 143 lb (64.9 kg)       Physical exam: Gen: alert, NAD, not ill appearing Pulm: speaks in complete sentences without increased work of breathing Psych: normal mood, normal thought content  Vascular: Right sided, raised, flesh colored, mass noted to right temporal region measuring approximately 2 cm. No bruising or surrounding erythema.      Results for orders placed or performed in visit on 05/03/21  TSH  Result Value Ref Range   TSH 0.60 0.35 - 4.50 uIU/mL  Lipid panel  Result Value Ref Range   Cholesterol 139 0 - 200 mg/dL  Triglycerides 55.0 0.0 - 149.0 mg/dL   HDL 99.83 >38.25 mg/dL   VLDL 05.3 0.0 - 97.6 mg/dL   LDL Cholesterol 77 0 - 99 mg/dL   Total CHOL/HDL Ratio 3    NonHDL 87.83   Hemoglobin A1c  Result Value Ref Range   Hgb A1c MFr Bld 5.1 4.6 - 6.5 %  CBC  Result Value Ref Range   WBC 11.2 (H) 4.0 - 10.5 K/uL   RBC 4.49 3.87 - 5.11 Mil/uL   Platelets 255.0 150.0 - 400.0 K/uL   Hemoglobin 13.8 12.0 - 15.0 g/dL   HCT 73.4 19.3 - 79.0 %   MCV 91.6 78.0 - 100.0 fl   MCHC 33.7 30.0 - 36.0 g/dL   RDW 24.0 97.3 - 53.2 %  Comprehensive metabolic panel  Result Value Ref Range   Sodium 141 135  - 145 mEq/L   Potassium 4.4 3.5 - 5.1 mEq/L   Chloride 105 96 - 112 mEq/L   CO2 30 19 - 32 mEq/L   Glucose, Bld 66 (L) 70 - 99 mg/dL   BUN 16 6 - 23 mg/dL   Creatinine, Ser 9.92 0.40 - 1.20 mg/dL   Total Bilirubin 0.6 0.2 - 1.2 mg/dL   Alkaline Phosphatase 60 39 - 117 U/L   AST 12 0 - 37 U/L   ALT 10 0 - 35 U/L   Total Protein 7.2 6.0 - 8.3 g/dL   Albumin 4.7 3.5 - 5.2 g/dL   GFR 42.68 >34.19 mL/min   Calcium 9.5 8.4 - 10.5 mg/dL   Assessment & Plan:   Problem List Items Addressed This Visit   None Visit Diagnoses     Right temporal lobe mass    -  Primary   Relevant Orders   CT ANGIO HEAD W OR WO CONTRAST   Ambulatory referral to Vascular Surgery        No orders of the defined types were placed in this encounter.  Orders Placed This Encounter  Procedures   CT ANGIO HEAD W OR WO CONTRAST    40 year old female with post traumatic pulsatile mass to right temporal region with headaches. Please evaluate.    Standing Status:   Future    Standing Expiration Date:   08/22/2022    Order Specific Question:   If indicated for the ordered procedure, I authorize the administration of contrast media per Radiology protocol    Answer:   Yes    Order Specific Question:   Is patient pregnant?    Answer:   No    Order Specific Question:   Preferred imaging location?    Answer:   [facility]   Ambulatory referral to Vascular Surgery    Referral Priority:   Routine    Referral Type:   Surgical    Referral Reason:   Specialty Services Required    Requested Specialty:   Vascular Surgery    Number of Visits Requested:   1    I discussed the assessment and treatment plan with the patient. The patient was provided an opportunity to ask questions and all were answered. The patient agreed with the plan and demonstrated an understanding of the instructions. The patient was advised to call back or seek an in-person evaluation if the symptoms worsen or if the condition fails to improve as  anticipated.  Follow up plan:  You will be contacted regarding your referral to vascular services and for the CT scan.  Please let us know if  you have not been contacted within a few days.  It was a pleasure to see you today!    Doreene Nest, NP

## 2021-08-22 NOTE — Assessment & Plan Note (Signed)
Posttraumatic since August 2022. Evident on exam today.  Given the pulsatile nature coupled with the location and posttraumatic event, will obtain CTA to evaluate for aneurysm.  Will also refer to vascular services for evaluation.  Exam today stable.

## 2021-08-23 ENCOUNTER — Other Ambulatory Visit: Payer: Self-pay

## 2021-08-23 ENCOUNTER — Ambulatory Visit (INDEPENDENT_AMBULATORY_CARE_PROVIDER_SITE_OTHER): Payer: No Typology Code available for payment source | Admitting: Obstetrics & Gynecology

## 2021-08-23 ENCOUNTER — Encounter: Payer: Self-pay | Admitting: Obstetrics & Gynecology

## 2021-08-23 VITALS — BP 120/80 | Ht 63.5 in | Wt 158.0 lb

## 2021-08-23 DIAGNOSIS — Z01419 Encounter for gynecological examination (general) (routine) without abnormal findings: Secondary | ICD-10-CM

## 2021-08-23 DIAGNOSIS — Z803 Family history of malignant neoplasm of breast: Secondary | ICD-10-CM

## 2021-08-23 DIAGNOSIS — L900 Lichen sclerosus et atrophicus: Secondary | ICD-10-CM | POA: Diagnosis not present

## 2021-08-23 DIAGNOSIS — N6323 Unspecified lump in the left breast, lower outer quadrant: Secondary | ICD-10-CM | POA: Diagnosis not present

## 2021-08-23 MED ORDER — CLOBETASOL PROPIONATE 0.05 % EX OINT
TOPICAL_OINTMENT | CUTANEOUS | 5 refills | Status: AC
  Filled 2021-08-23: qty 30, 15d supply, fill #0

## 2021-08-23 NOTE — Progress Notes (Signed)
HPI:      Felicia Mcdowell is a 40 y.o. [id number] who LMP was Patient's last menstrual period was 08/04/2021., she presents today for her annual examination. The patient has no complaints today. The patient is sexually active. Her last pap: approximate date 2021 and was normal and last mammogram: approximate date 2020 and was normal and she has gotten these in past due to [place] (at her young age).  She underwent breast augmentation surgery in Feb 2022.  Recently noted left LOQ smal pea sized NT mobile breast mass near scar.  The patient does perform self breast exams.  There is notable family history of breast or ovarian cancer in her family.  Pt is BRCA NEG.  She denies recent problems with Lichen Sclerosus.  No itching or dryness noted.  She has resumes light but monthly periods (prior ablaiton many years ago.)  The patient has regular exercise: yes.  The patient denies current symptoms of depression.    GYN History: Contraception: tubal ligation  PMHx: Past Medical History:  Diagnosis Date   Anxiety    Back pain    BRCA negative 07/2020   MyRisk neg   Constipation    Family history of breast cancer    GERD (gastroesophageal reflux disease)    Headache    High cholesterol    Hypertension    Increased risk of breast cancer 07/2020   IBIS=26.5%/riskscore-18.4%   Prediabetes    Past Surgical History:  Procedure Laterality Date   BREAST SURGERY     HYSTEROSCOPY WITH NOVASURE N/A 01/05/2016   Procedure: HYSTEROSCOPY WITH NOVASURE;  Surgeon: Honor Loh Ward, MD;  Location: [facility] ORS;  Service: Gynecology;  Laterality: N/A;   LAPAROSCOPIC TUBAL LIGATION Bilateral 01/05/2016   Procedure: bilateral laparoscopic salpingectomy, endometrial abalation;  Surgeon: Honor Loh Ward, MD;  Location: [facility] ORS;  Service: Gynecology;  Laterality: Bilateral;   TONSILLECTOMY     Family History  Problem Relation Age of Onset   Cancer Mother 66       breast CA, now with metastatic cancer   Depression  Mother    Anxiety disorder Mother    Breast cancer Mother 41   Heart disease Father    Hyperlipidemia Father    Hypertension Father    Sudden death Father    Hypertension Maternal Grandmother    Kidney disease Maternal Grandmother    Social History   Tobacco Use   Smoking status: Never   Smokeless tobacco: Never  Vaping Use   Vaping Use: Never used  Substance Use Topics   Alcohol use: Yes    Comment: 1-2 glasses wine on weekend nights   Drug use: No    Current Outpatient Medications:    buPROPion (WELLBUTRIN SR) 150 MG 12 hr tablet, Take 1 tablet (150 mg total) by mouth 2 (two) times daily. For smoking cessation., Disp: 180 tablet, Rfl: 0   busPIRone (BUSPAR) 10 MG tablet, Take 10 mg by mouth 2 (two) times daily as needed., Disp: , Rfl:    clobetasol ointment (TEMOVATE) 0.05 %, Apply to affected area when there are symptoms nightly as needed., Disp: 30 g, Rfl: 5   FLUoxetine (PROZAC) 20 MG capsule, TAKE 1 CAPSULE (20 MG TOTAL) BY MOUTH DAILY., Disp: 90 capsule, Rfl: 3   LORazepam (ATIVAN) 0.5 MG tablet, Take 1 tablet by mouth 30-60 minutes prior to flights., Disp: 10 tablet, Rfl: 0   simvastatin (ZOCOR) 10 MG tablet, TAKE 1 TABLET (10 MG TOTAL) BY MOUTH AT  BEDTIME FOR CHOLESTEROL., Disp: 90 tablet, Rfl: 3   traZODone (DESYREL) 50 MG tablet, TAKE 1/2 TO 1 TABLET (25-50 MG TOTAL) BY MOUTH AT BEDTIME AS NEEDED FOR SLEEP., Disp: 90 tablet, Rfl: 0 Allergies: Augmentin [amoxicillin-pot clavulanate]  Review of Systems  Constitutional:  Negative for chills, fever and malaise/fatigue.  HENT:  Negative for congestion, sinus pain and sore throat.   Eyes:  Negative for blurred vision and pain.  Respiratory:  Negative for cough and wheezing.   Cardiovascular:  Negative for chest pain and leg swelling.  Gastrointestinal:  Negative for abdominal pain, constipation, diarrhea, heartburn, nausea and vomiting.  Genitourinary:  Negative for dysuria, frequency, hematuria and urgency.   Musculoskeletal:  Negative for back pain, joint pain, myalgias and neck pain.  Skin:  Negative for itching and rash.  Neurological:  Negative for dizziness, tremors and weakness.  Endo/Heme/Allergies:  Does not bruise/bleed easily.  Psychiatric/Behavioral:  Negative for depression. The patient is not nervous/anxious and does not have insomnia.    Objective: BP 120/80   Ht 5' 3.5" (1.613 m)   Wt 158 lb (71.7 kg)   LMP 08/04/2021   BMI 27.55 kg/m   Filed Weights   08/23/21 1513  Weight: 158 lb (71.7 kg)   Body mass index is 27.55 kg/m. Physical Exam Constitutional:      General: She is not in acute distress.    Appearance: She is well-developed.  Genitourinary:     Bladder, rectum and urethral meatus normal.     No lesions in the vagina.     Right Labia: No rash, tenderness or lesions.    Left Labia: No tenderness, lesions or rash.    No vaginal bleeding.      Right Adnexa: not tender and no mass present.    Left Adnexa: not tender and no mass present.    No cervical motion tenderness, friability, lesion or polyp.     Uterus is not enlarged.     No uterine mass detected.    Pelvic exam was performed with patient in the lithotomy position.  Breasts:    Right: No mass, skin change or tenderness.     Left: Mass present. No skin change or tenderness.     Breast exam comments: 0.5 cm, mobile, NT mass close to scar on left breast from prior augmentation surgery (sub-pectoral implant). HENT:     Head: Normocephalic and atraumatic. No laceration.     Right Ear: Hearing normal.     Left Ear: Hearing normal.     Mouth/Throat:     Pharynx: Uvula midline.  Eyes:     Pupils: Pupils are equal, round, and reactive to light.  Neck:     Thyroid: No thyromegaly.  Cardiovascular:     Rate and Rhythm: Normal rate and regular rhythm.     Heart sounds: No murmur heard.   No friction rub. No gallop.  Pulmonary:     Effort: Pulmonary effort is normal. No respiratory distress.      Breath sounds: Normal breath sounds. No wheezing.  Chest:    Abdominal:     General: Bowel sounds are normal. There is no distension.     Palpations: Abdomen is soft.     Tenderness: There is no abdominal tenderness. There is no rebound.  Musculoskeletal:        General: Normal range of motion.     Cervical back: Normal range of motion and neck supple.  Neurological:     Mental Status: She is  alert and oriented to person, place, and time.     Cranial Nerves: No cranial nerve deficit.  Skin:    General: Skin is warm and dry.  Psychiatric:        Judgment: Judgment normal.  Vitals reviewed.    Assessment:  ANNUAL EXAM 1. Women's annual routine gynecological examination   2. Family history of breast cancer   3. Mass of lower outer quadrant of left breast   4. Lichen sclerosus      Screening Plan:            1.  Cervical Screening-  Pap smear schedule reviewed with patient, Pap smear to be scheduled 2024  2. Breast screening- Exam annually and mammogram>40 planned   3. Colonoscopy every 10 years, Hemoccult testing - after age 38  4. Labs managed by PCP  5. Counseling for contraception: bilateral tubal ligation  6. Family history of breast cancer Due to recent finding of mass will have LEFT IMAGING done - US BREAST LTD UNI LEFT INC AXILLA; Future - MM DIAG BREAST TOMO UNI LEFT; Future  7. Mass of lower outer quadrant of left breast Mass on left, imaging to assess, likely related to recent surgery - US BREAST LTD UNI LEFT INC AXILLA; Future - MM DIAG BREAST TOMO UNI LEFT; Future  8. Lichen sclerosus - clobetasol ointment (TEMOVATE) 0.05 %; Apply to affected area when there are symptoms nightly as needed.  Dispense: 30 g; Refill: 5 - use as needed (rare use)      F/U  Return in about 1 year (around 08/23/2022) for Annual.  Barnett Applebaum, MD, [facility], [facility] 08/23/2021  3:55 PM

## 2021-08-23 NOTE — Patient Instructions (Signed)
PAP every three years Mammogram every year    Call [phone] to schedule at [place] Colonoscopy every 10 years Labs yearly (with PCP)  Thank you for choosing [facility]. As part of our ongoing efforts to improve patient experience, we would appreciate your feedback. Please fill out the short survey that you will receive by mail or MyChart. Your opinion is important to us! - Dr. Giara Mcgaughey   

## 2021-08-24 ENCOUNTER — Ambulatory Visit
Admission: RE | Admit: 2021-08-24 | Discharge: 2021-08-24 | Disposition: A | Discharge status: Home / Self Care | Payer: No Typology Code available for payment source | Source: Ambulatory Visit | Attending: Primary Care | Admitting: Primary Care

## 2021-08-24 DIAGNOSIS — G9389 Other specified disorders of brain: Secondary | ICD-10-CM | POA: Diagnosis present

## 2021-08-24 MED ORDER — IOHEXOL 350 MG/ML SOLN
75.0000 mL | Freq: Once | INTRAVENOUS | Status: AC | PRN
  Administered 2021-08-24: 75 mL via INTRAVENOUS

## 2021-08-25 ENCOUNTER — Ambulatory Visit (INDEPENDENT_AMBULATORY_CARE_PROVIDER_SITE_OTHER): Payer: No Typology Code available for payment source | Admitting: Vascular Surgery

## 2021-08-25 ENCOUNTER — Other Ambulatory Visit: Payer: Self-pay

## 2021-08-25 ENCOUNTER — Encounter (INDEPENDENT_AMBULATORY_CARE_PROVIDER_SITE_OTHER): Payer: Self-pay | Admitting: Vascular Surgery

## 2021-08-25 VITALS — BP 130/83 | HR 92 | Resp 16 | Wt 156.0 lb

## 2021-08-25 DIAGNOSIS — I10 Essential (primary) hypertension: Secondary | ICD-10-CM

## 2021-08-25 DIAGNOSIS — I728 Aneurysm of other specified arteries: Secondary | ICD-10-CM

## 2021-08-25 DIAGNOSIS — E782 Mixed hyperlipidemia: Secondary | ICD-10-CM

## 2021-08-25 HISTORY — DX: Aneurysm of other specified arteries: I72.8

## 2021-08-25 NOTE — Assessment & Plan Note (Signed)
lipid control important in reducing the progression of atherosclerotic disease. Continue statin therapy  

## 2021-08-25 NOTE — Assessment & Plan Note (Signed)
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  It is painful to her and causing headaches.  She also does not like the prominent knot that it creates above her right lateral eye area.  For this reason, she desires to have it treated.  I given her 2 options for treatment.  We can perform an embolization to the area and eliminate the pseudoaneurysm without incision or we can make a small incision and excise the small artery.  She wants to go home and discuss this with her fianc which is certainly reasonable.  She will give [place] a call when she is decided how she would like to have this addressed.

## 2021-08-25 NOTE — Progress Notes (Signed)
Patient ID: Felicia Mcdowell, female   DOB: [birth year], 40 y.o.   MRN: [id number]  Chief Complaint  Patient presents with   New Patient (Initial Visit)    Ref Carlis Abbott post-traumatic pulsatile mass right temporal region    HPI Felicia Mcdowell is a 40 y.o. female.  I am asked to see the patient by Dr. Carlis Abbott for evaluation of a pulsatile mass on the right temple.  She had a fall with a local trauma to the area several weeks ago.  She has had a small pulsatile mass there ever since.  She is now getting worsening pain and headaches in the area.  She had a CT angiogram of her head which I have independently reviewed which does show a small aneurysm of the branch of the right superficial temporal artery measuring 3 to 4 mm in diameter.      Past Medical History:  Diagnosis Date   Anxiety    Back pain    BRCA negative 07/2020   MyRisk neg   Constipation    Family history of breast cancer    GERD (gastroesophageal reflux disease)    Headache    High cholesterol    Increased risk of breast cancer 07/2020   IBIS=26.5%/riskscore-18.4%   Prediabetes     Past Surgical History:  Procedure Laterality Date   BREAST SURGERY     HYSTEROSCOPY WITH NOVASURE N/A 01/05/2016   Procedure: HYSTEROSCOPY WITH NOVASURE;  Surgeon: Honor Loh Ward, MD;  Location: [facility];  Service: Gynecology;  Laterality: N/A;   LAPAROSCOPIC TUBAL LIGATION Bilateral 01/05/2016   Procedure: bilateral laparoscopic salpingectomy, endometrial abalation;  Surgeon: Honor Loh Ward, MD;  Location: [facility];  Service: Gynecology;  Laterality: Bilateral;   TONSILLECTOMY       Family History  Problem Relation Age of Onset   Cancer Mother 32       breast CA, now with metastatic cancer   Depression Mother    Anxiety disorder Mother    Breast cancer Mother 30   Heart disease Father    Hyperlipidemia Father    Hypertension Father    Sudden death Father    Hypertension Maternal Grandmother    Kidney disease Maternal Grandmother        Social History   Tobacco Use   Smoking status: Never   Smokeless tobacco: Never  Vaping Use   Vaping Use: Never used  Substance Use Topics   Alcohol use: Yes    Comment: 1-2 glasses wine on weekend nights   Drug use: No     Allergies  Allergen Reactions   Augmentin [Amoxicillin-Pot Clavulanate] Hives and Rash    Rash     Current Outpatient Medications  Medication Sig Dispense Refill   buPROPion (WELLBUTRIN SR) 150 MG 12 hr tablet Take 1 tablet (150 mg total) by mouth 2 (two) times daily. For smoking cessation. 180 tablet 0   busPIRone (BUSPAR) 10 MG tablet Take 10 mg by mouth 2 (two) times daily as needed.     clobetasol ointment (TEMOVATE) 0.05 % Apply to affected area when there are symptoms nightly as needed. 30 g 5   FLUoxetine (PROZAC) 20 MG capsule TAKE 1 CAPSULE (20 MG TOTAL) BY MOUTH DAILY. 90 capsule 3   LORazepam (ATIVAN) 0.5 MG tablet Take 1 tablet by mouth 30-60 minutes prior to flights. 10 tablet 0   simvastatin (ZOCOR) 10 MG tablet TAKE 1 TABLET (10 MG TOTAL) BY MOUTH AT BEDTIME FOR CHOLESTEROL. 90 tablet 3  traZODone (DESYREL) 50 MG tablet TAKE 1/2 TO 1 TABLET (25-50 MG TOTAL) BY MOUTH AT BEDTIME AS NEEDED FOR SLEEP. 90 tablet 0   No current facility-administered medications for this visit.      REVIEW OF SYSTEMS (Negative unless checked)  Constitutional: _0 Weight loss  _1 Fever  _2 Chills Cardiac: _3 Chest pain   _4 Chest pressure   _5 Palpitations   _6 Shortness of breath when laying flat   _7 Shortness of breath at rest   _8 Shortness of breath with exertion. Vascular:  _9 Pain in legs with walking   _10 Pain in legs at rest   _11 Pain in legs when laying flat   _12 Claudication   _13 Pain in feet when walking  _14 Pain in feet at rest  _15 Pain in feet when laying flat   _16 History of DVT   _17 Phlebitis   _18 Swelling in legs   _19 Varicose veins   _20 Non-healing ulcers Pulmonary:   _21 Uses home oxygen   _22 Productive cough   _23 Hemoptysis   _24 Wheeze  _25 COPD    _26 Asthma Neurologic:  _27 Dizziness  _28 Blackouts   _29 Seizures   _30 History of stroke   _31 History of TIA  _32 Aphasia   _33 Temporary blindness   _34 Dysphagia   _35 Weakness or numbness in arms   _36 Weakness or numbness in legs Musculoskeletal:  _37 Arthritis   _38 Joint swelling   _39 Joint pain   _40 Low back pain Hematologic:  _41 Easy bruising  _42 Easy bleeding   _43 Hypercoagulable state   _44 Anemic  _45 Hepatitis Gastrointestinal:  _46 Blood in stool   _47 Vomiting blood  _48 Gastroesophageal reflux/heartburn   _49 Abdominal pain Genitourinary:  _50 Chronic kidney disease   _51 Difficult urination  _52 Frequent urination  _53 Burning with urination   _54 Hematuria Skin:  _55 Rashes   _56 Ulcers   _57 Wounds Psychological:  _58 History of anxiety   _59  History of major depression.    Physical Exam BP 130/83 (BP Location: Left Arm)   Pulse 92   Resp 16   Wt 156 lb (70.8 kg)   LMP 08/04/2021   BMI 27.20 kg/m  Gen:  WD/WN, NAD Head: [place]/AT, No temporalis wasting. Prominent temp pulse not noted with a strongly pulsatile area on the right temporal region. Ear/Nose/Throat: Hearing grossly intact, nares w/o erythema or drainage, oropharynx w/o Erythema/Exudate Eyes: Conjunctiva clear, sclera non-icteric  Neck: trachea midline.  No JVD.  Pulmonary:  Good air movement, respirations not labored, no use of accessory muscles  Cardiac: RRR, no JVD Vascular:  Vessel Right Left  Radial Palpable Palpable                                    Musculoskeletal: M/S 5/5 throughout.  Extremities without ischemic changes.  No deformity or atrophy. No edema. Neurologic: Sensation grossly intact in extremities.  Symmetrical.  Speech is fluent. Motor exam as listed above. Psychiatric: Judgment intact, Mood & affect appropriate for pt's clinical situation. Dermatologic: No rashes or ulcers noted.  No cellulitis or open wounds.    Radiology CT ANGIO HEAD W OR WO CONTRAST  Result Date: 08/24/2021 CLINICAL DATA:  Pulsatile mass in the right  temporal region. Trauma to the right side of the head on 07/14/2021. EXAM: CT ANGIOGRAPHY HEAD TECHNIQUE: Multidetector CT imaging of the head was performed using the standard protocol during bolus administration of intravenous contrast. Multiplanar CT image reconstructions and MIPs were obtained to evaluate the vascular anatomy. CONTRAST:  72m OMNIPAQUE IOHEXOL 350 MG/ML SOLN COMPARISON:  None FINDINGS: CT HEAD Brain: The brain shows a normal appearance without evidence of  malformation, atrophy, old or acute small or large vessel infarction, mass lesion, hemorrhage, hydrocephalus or extra-axial collection. Vascular: No hyperdense vessel. No evidence of atherosclerotic calcification. Skull: Normal.  No traumatic finding.  No focal bone lesion. Sinuses/Orbits: Sinuses are clear. Orbits appear normal. Mastoids are clear. Other: None significant CTA HEAD Anterior circulation: Both internal carotid arteries are widely patent through the skull base and siphon regions. The anterior and middle cerebral vessels are normal without evidence of branch vessel occlusion, stenosis, aneurysm or vascular malformation. Attention to the external carotid vessels particularly in the right temporal region shows a brightly enhancing 3-4 mm focus likely to be a small superficial pseudoaneurysm. Posterior circulation: Both vertebral arteries widely patent to the basilar. No basilar stenosis. Posterior circulation branch vessels are normal. Venous sinuses: Patent and normal. Anatomic variants: None significant. Review of the MIP images confirms the above findings. IMPRESSION: 3-4 mm brightly enhancing nodule in the subcutaneous tissues of the right frontotemporal junction region likely to represent a small pseudoaneurysm of an external carotid branch, given the provided history. Electronically Signed   By: Nelson Chimes M.D.   On: 08/24/2021 16:36    Labs No results found for this or any previous visit (from the past 2160  hour(s)).  Assessment/Plan:  Aneurysm of right superficial temporal artery (HCC) The patient has a posttraumatic aneurysm of the distal right superficial temporal artery.  It is painful to her and causing headaches.  She also does not like the prominent knot that it creates above her right lateral eye area.  For this reason, she desires to have it treated.  I given her 2 options for treatment.  We can perform an embolization to the area and eliminate the pseudoaneurysm without incision or we can make a small incision and excise the small artery.  She wants to go home and discuss this with her fianc which is certainly reasonable.  She will give [place] a call when she is decided how she would like to have this addressed.  Essential hypertension blood pressure control important in reducing the progression of atherosclerotic disease. On appropriate oral medications.   HLD (hyperlipidemia) lipid control important in reducing the progression of atherosclerotic disease. Continue statin therapy      Leotis Pain 08/25/2021, 9:59 AM   This note was created with Dragon medical transcription system.  Any errors from dictation are unintentional.

## 2021-08-25 NOTE — Assessment & Plan Note (Signed)
blood pressure control important in reducing the progression of atherosclerotic disease. On appropriate oral medications.  

## 2021-08-27 ENCOUNTER — Other Ambulatory Visit: Payer: Self-pay | Admitting: Obstetrics & Gynecology

## 2021-08-27 DIAGNOSIS — Z803 Family history of malignant neoplasm of breast: Secondary | ICD-10-CM

## 2021-08-27 DIAGNOSIS — Z1231 Encounter for screening mammogram for malignant neoplasm of breast: Secondary | ICD-10-CM

## 2021-09-04 ENCOUNTER — Other Ambulatory Visit: Payer: Self-pay

## 2021-09-06 ENCOUNTER — Ambulatory Visit
Admission: RE | Admit: 2021-09-06 | Discharge: 2021-09-06 | Disposition: A | Discharge status: Home / Self Care | Payer: No Typology Code available for payment source | Source: Ambulatory Visit | Attending: Obstetrics & Gynecology | Admitting: Obstetrics & Gynecology

## 2021-09-06 ENCOUNTER — Other Ambulatory Visit: Payer: Self-pay

## 2021-09-06 ENCOUNTER — Other Ambulatory Visit: Payer: Self-pay | Admitting: Obstetrics & Gynecology

## 2021-09-06 DIAGNOSIS — Z1231 Encounter for screening mammogram for malignant neoplasm of breast: Secondary | ICD-10-CM | POA: Diagnosis present

## 2021-09-06 DIAGNOSIS — Z803 Family history of malignant neoplasm of breast: Secondary | ICD-10-CM

## 2021-09-06 DIAGNOSIS — N6323 Unspecified lump in the left breast, lower outer quadrant: Secondary | ICD-10-CM

## 2021-10-17 ENCOUNTER — Other Ambulatory Visit: Payer: Self-pay | Admitting: Primary Care

## 2021-10-17 ENCOUNTER — Other Ambulatory Visit: Payer: Self-pay

## 2021-10-17 DIAGNOSIS — Z720 Tobacco use: Secondary | ICD-10-CM

## 2021-10-17 DIAGNOSIS — F5104 Psychophysiologic insomnia: Secondary | ICD-10-CM

## 2021-10-18 ENCOUNTER — Other Ambulatory Visit: Payer: Self-pay

## 2021-10-18 MED FILL — Bupropion HCl Tab ER 12HR 150 MG: ORAL | 90 days supply | Qty: 180 | Fill #0 | Status: CN

## 2021-10-18 MED FILL — Trazodone HCl Tab 50 MG: ORAL | 90 days supply | Qty: 90 | Fill #0 | Status: AC

## 2021-10-22 ENCOUNTER — Other Ambulatory Visit: Payer: Self-pay | Admitting: Primary Care

## 2021-10-22 DIAGNOSIS — F32A Depression, unspecified: Secondary | ICD-10-CM

## 2021-10-22 MED ORDER — BUSPIRONE HCL 10 MG PO TABS
10.0000 mg | ORAL_TABLET | Freq: Two times a day (BID) | ORAL | 1 refills | Status: DC | PRN
  Filled 2021-10-22 – 2021-12-08 (×2): qty 180, 90d supply, fill #0

## 2021-10-23 ENCOUNTER — Other Ambulatory Visit: Payer: Self-pay

## 2021-11-07 ENCOUNTER — Other Ambulatory Visit: Payer: Self-pay

## 2021-12-08 ENCOUNTER — Other Ambulatory Visit: Payer: Self-pay

## 2022-02-28 ENCOUNTER — Ambulatory Visit (INDEPENDENT_AMBULATORY_CARE_PROVIDER_SITE_OTHER): Payer: No Typology Code available for payment source | Admitting: Primary Care

## 2022-02-28 ENCOUNTER — Encounter: Payer: Self-pay | Admitting: Primary Care

## 2022-02-28 VITALS — BP 118/78 | HR 76 | Ht 63.5 in | Wt 155.0 lb

## 2022-02-28 DIAGNOSIS — Z87898 Personal history of other specified conditions: Secondary | ICD-10-CM

## 2022-02-28 DIAGNOSIS — G8929 Other chronic pain: Secondary | ICD-10-CM

## 2022-02-28 DIAGNOSIS — E559 Vitamin D deficiency, unspecified: Secondary | ICD-10-CM | POA: Diagnosis not present

## 2022-02-28 DIAGNOSIS — R7303 Prediabetes: Secondary | ICD-10-CM | POA: Diagnosis not present

## 2022-02-28 DIAGNOSIS — F419 Anxiety disorder, unspecified: Secondary | ICD-10-CM

## 2022-02-28 DIAGNOSIS — M5441 Lumbago with sciatica, right side: Secondary | ICD-10-CM

## 2022-02-28 DIAGNOSIS — E782 Mixed hyperlipidemia: Secondary | ICD-10-CM | POA: Diagnosis not present

## 2022-02-28 DIAGNOSIS — I10 Essential (primary) hypertension: Secondary | ICD-10-CM

## 2022-02-28 DIAGNOSIS — F32A Depression, unspecified: Secondary | ICD-10-CM

## 2022-02-28 LAB — COMPREHENSIVE METABOLIC PANEL
ALT: 8 U/L (ref 0–35)
AST: 11 U/L (ref 0–37)
Albumin: 4.5 g/dL (ref 3.5–5.2)
Alkaline Phosphatase: 57 U/L (ref 39–117)
BUN: 9 mg/dL (ref 6–23)
CO2: 30 mEq/L (ref 19–32)
Calcium: 9 mg/dL (ref 8.4–10.5)
Chloride: 104 mEq/L (ref 96–112)
Creatinine, Ser: 0.82 mg/dL (ref 0.40–1.20)
GFR: 90.63 mL/min (ref >60.00)
Glucose, Bld: 75 mg/dL (ref 70–99)
Potassium: 4.3 mEq/L (ref 3.5–5.1)
Sodium: 139 mEq/L (ref 135–145)
Total Bilirubin: 0.4 mg/dL (ref 0.2–1.2)
Total Protein: 6.8 g/dL (ref 6.0–8.3)

## 2022-02-28 LAB — VITAMIN B12: Vitamin B-12: 848 pg/mL (ref 211–911)

## 2022-02-28 LAB — LIPID PANEL
Cholesterol: 133 mg/dL (ref 0–200)
HDL: 40 mg/dL (ref >39.00)
LDL Cholesterol: 82 mg/dL (ref 0–99)
NonHDL: 93.32
Total CHOL/HDL Ratio: 3
Triglycerides: 58 mg/dL (ref 0.0–149.0)
VLDL: 11.6 mg/dL (ref 0.0–40.0)

## 2022-02-28 LAB — CBC
HCT: 41.1 % (ref 36.0–46.0)
Hemoglobin: 13.9 g/dL (ref 12.0–15.0)
MCHC: 33.7 g/dL (ref 30.0–36.0)
MCV: 91.4 fl (ref 78.0–100.0)
Platelets: 249 10*3/uL (ref 150.0–400.0)
RBC: 4.49 Mil/uL (ref 3.87–5.11)
RDW: 12.4 % (ref 11.5–15.5)
WBC: 7.8 10*3/uL (ref 4.0–10.5)

## 2022-02-28 LAB — HEMOGLOBIN A1C: Hgb A1c MFr Bld: 5.3 % (ref 4.6–6.5)

## 2022-02-28 LAB — VITAMIN D 25 HYDROXY (VIT D DEFICIENCY, FRACTURES): VITD: 45.52 ng/mL (ref 30.00–100.00)

## 2022-02-28 NOTE — Progress Notes (Signed)
? ?Subjective:  ? ? Patient ID: Felicia Mcdowell, female    DOB: 02/05/1981, 41 y.o.   MRN: [id number] ? ?HPI ? ?Felicia Mcdowell is a very pleasant 41 y.o. female with a history of chronic back pain, hyperlipidemia, anxiety and depression who presents today to discuss chronic back pain and for follow up of chronic conditions. ? ?1) Chronic Back Pain: Chronic for years, historically located to mid lower back without sciatica, but over the last several years she's noticed intermittent right sided sciatica. Sciatica has become more frequent with symptoms of numbness down to her toes. Flares of symptoms occur three times weekly.  ? ?She's also noticed chronic tailbone pain over the last two years. Questions is this is associated.  ? ?She has been taking Aleve on occasion with temporary relief.  She has completed physical therapy and has also seen a chiropractor without improvement in her symptoms.  ? ?Last year she underwent lumbar plain films which were negative. ? ?She denies loss of bowel/bladder control, left lower extremity pain or numbness, recent injury or trauma. ? ?2) Hyperlipidemia: Currently managed on simvastatin 10 mg for which she has taken since her early 84s.  She is due for repeat lipid panel today. ? ?3) Anxiety and Depression: Chronic.  Currently prescribed fluoxetine 20 mg daily, trazodone 50 mg at bedtime, BuSpar 10 mg twice daily as needed. ? ?Anxiety and depression symptoms have improved significantly over the last year as she is in a healthier relationship and has recently moved.  She is no longer taking her fluoxetine.  She has reduced her trazodone to 12.5 mg nightly.  She uses lorazepam as needed for flight anxiety. ? ? ?BP Readings from Last 3 Encounters:  ?02/28/22 118/78  ?08/25/21 130/83  ?08/23/21 120/80  ? ? ? ?Review of Systems  ?Respiratory:  Negative for shortness of breath.   ?Cardiovascular:  Negative for chest pain.  ?Musculoskeletal:  Positive for arthralgias and back pain.   ?Neurological:  Positive for numbness.  ?Psychiatric/Behavioral:  Negative for sleep disturbance. The patient is not nervous/anxious.   ? ?   ? ? ?Past Medical History:  ?Diagnosis Date  ? Anxiety   ? Back pain   ? BRCA negative 07/2020  ? MyRisk neg  ? Constipation   ? Family history of breast cancer   ? GERD (gastroesophageal reflux disease)   ? Headache   ? High cholesterol   ? Increased risk of breast cancer 07/2020  ? IBIS=26.5%/riskscore-18.4%  ? Prediabetes   ? ? ?Social History  ? ?Socioeconomic History  ? Marital status: Divorced  ?  Spouse name: jeff Barreira  ? Number of children: 2  ? Years of education: Not on file  ? Highest education level: Not on file  ?Occupational History  ? Occupation: Designer, jewellery  ?  Employer: [employer]  ?Tobacco Use  ? Smoking status: Every Day  ?  Packs/day: 0.50  ?  Types: Cigarettes  ? Smokeless tobacco: Never  ?Vaping Use  ? Vaping Use: Never used  ?Substance and Sexual Activity  ? Alcohol use: Yes  ?  Comment: 1-2 glasses wine on weekend nights  ? Drug use: No  ? Sexual activity: Yes  ?  Partners: Male  ?  Birth control/protection: Surgical  ?Other Topics Concern  ? Not on file  ?Social History Narrative  ? 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[employer]  ? Married, two daughters  ? Enjoys exercising, reading  ?   ?   ? ?  Social Determinants of Health  ? ?Financial Resource Strain: Not on file  ?Food Insecurity: Not on file  ?Transportation Needs: Not on file  ?Physical Activity: Not on file  ?Stress: Not on file  ?Social Connections: Not on file  ?Intimate Partner Violence: Not on file  ? ? ?Past Surgical History:  ?Procedure Laterality Date  ? AUGMENTATION MAMMAPLASTY Bilateral 12/2019  ? BREAST SURGERY    ? HYSTEROSCOPY WITH NOVASURE N/A 01/05/2016  ? Procedure: HYSTEROSCOPY WITH NOVASURE;  Surgeon: Honor Loh Ward, MD;  Location: [facility];  Service: Gynecology;  Laterality: N/A;  ? LAPAROSCOPIC TUBAL LIGATION Bilateral 01/05/2016  ? Procedure: bilateral laparoscopic  salpingectomy, endometrial abalation;  Surgeon: Honor Loh Ward, MD;  Location: [facility];  Service: Gynecology;  Laterality: Bilateral;  ? TONSILLECTOMY    ? ? ?Family History  ?Problem Relation Age of Onset  ? Cancer Mother 83  ?     breast CA, now with metastatic cancer  ? Depression Mother   ? Anxiety disorder Mother   ? Breast cancer Mother 64  ? Heart disease Father   ? Hyperlipidemia Father   ? Hypertension Father   ? Sudden death Father   ? Hypertension Maternal Grandmother   ? Kidney disease Maternal Grandmother   ? ? ?Allergies  ?Allergen Reactions  ? Augmentin [Amoxicillin-Pot Clavulanate] Hives and Rash  ?  Rash ?  ? ? ?Current Outpatient Medications on File Prior to Visit  ?Medication Sig Dispense Refill  ? busPIRone (BUSPAR) 10 MG tablet Take 1 tablet (10 mg total) by mouth 2 (two) times daily as needed. For anxiety. 180 tablet 1  ? simvastatin (ZOCOR) 10 MG tablet TAKE 1 TABLET (10 MG TOTAL) BY MOUTH AT BEDTIME FOR CHOLESTEROL. 90 tablet 3  ? clobetasol ointment (TEMOVATE) 0.05 % Apply to affected area when there are symptoms nightly as needed. (Patient not taking: Reported on 02/28/2022) 30 g 5  ? FLUoxetine (PROZAC) 20 MG capsule TAKE 1 CAPSULE (20 MG TOTAL) BY MOUTH DAILY. (Patient not taking: Reported on 02/28/2022) 90 capsule 3  ? LORazepam (ATIVAN) 0.5 MG tablet Take 1 tablet by mouth 30-60 minutes prior to flights. (Patient not taking: Reported on 02/28/2022) 10 tablet 0  ? traZODone (DESYREL) 50 MG tablet TAKE 1/2 TO 1 TABLET (25-50 MG TOTAL) BY MOUTH AT BEDTIME AS NEEDED FOR SLEEP. (Patient not taking: Reported on 02/28/2022) 90 tablet 1  ? ?No current facility-administered medications on file prior to visit.  ? ? ?BP 118/78   Pulse 76   Ht 5' 3.5" (1.613 m)   Wt 155 lb (70.3 kg)   SpO2 98%   BMI 27.03 kg/m?  ?Objective:  ? Physical Exam ?Constitutional:   ?   General: She is not in acute distress. ?Cardiovascular:  ?   Rate and Rhythm: Normal rate and regular rhythm.  ?Pulmonary:  ?   Effort:  Pulmonary effort is normal.  ?   Breath sounds: Normal breath sounds.  ?Musculoskeletal:  ?   Cervical back: Neck supple.  ?   Lumbar back: No tenderness. Decreased range of motion. Negative right straight leg raise test and negative left straight leg raise test.  ?     Back: ? ?   Comments: Decrease in range of motion with extension. ?5 out of 5 strength to bilateral lower extremities  ?Skin: ?   General: Skin is warm and dry.  ?Psychiatric:     ?   Mood and Affect: Mood normal.  ? ? ? ? ? ?   ?  Assessment & Plan:  ? ? ? ? ?This visit occurred during the SARS-CoV-2 public health emergency.  Safety protocols were in place, including screening questions prior to the visit, additional usage of staff PPE, and extensive cleaning of exam room while observing appropriate contact time as indicated for disinfecting solutions.  ?

## 2022-02-28 NOTE — Assessment & Plan Note (Signed)
Repeat A1c pending. ? ?Commended her on weight loss over the years. ?

## 2022-02-28 NOTE — Assessment & Plan Note (Addendum)
Improved! ? ?Remain off of fluoxetine for now. ?Continue trazodone 12.5 mg nightly. ? ?Continue buspirone 10 mg twice daily as needed. ?Continue lorazepam as needed for airplane travel. ?

## 2022-02-28 NOTE — Assessment & Plan Note (Signed)
Controlled. ? ?Continue off of treatment. ?Continue to monitor. ?

## 2022-02-28 NOTE — Assessment & Plan Note (Signed)
Progressing, now with frequent right-sided sciatica. ?Exam today without alarm signs. ? ?She has completed lumbar plain films and physical therapy without improvement. ? ?We will now proceed with MRI of the lumbar spine and sacrum. ? ?Continue Aleve as needed. ?Await results. ?

## 2022-02-28 NOTE — Assessment & Plan Note (Signed)
Continue simvastatin 10 mg daily. Repeat lipid panel pending. 

## 2022-04-11 ENCOUNTER — Ambulatory Visit
Admission: RE | Admit: 2022-04-11 | Discharge: 2022-04-11 | Disposition: A | Discharge status: Home / Self Care | Payer: No Typology Code available for payment source | Source: Ambulatory Visit | Attending: Primary Care | Admitting: Primary Care

## 2022-04-11 DIAGNOSIS — M5441 Lumbago with sciatica, right side: Secondary | ICD-10-CM | POA: Diagnosis present

## 2022-04-11 DIAGNOSIS — G8929 Other chronic pain: Secondary | ICD-10-CM | POA: Diagnosis present

## 2022-09-17 ENCOUNTER — Encounter (INDEPENDENT_AMBULATORY_CARE_PROVIDER_SITE_OTHER): Payer: Self-pay

## 2022-09-25 ENCOUNTER — Other Ambulatory Visit: Payer: Self-pay | Admitting: Primary Care

## 2022-09-25 DIAGNOSIS — R051 Acute cough: Secondary | ICD-10-CM

## 2022-09-25 MED ORDER — AZITHROMYCIN 250 MG PO TABS: ORAL_TABLET | ORAL | 0 refills | Status: AC

## 2022-12-30 ENCOUNTER — Other Ambulatory Visit: Payer: Self-pay | Admitting: Primary Care

## 2022-12-30 DIAGNOSIS — Z720 Tobacco use: Secondary | ICD-10-CM

## 2022-12-30 MED ORDER — VARENICLINE TARTRATE (STARTER) 0.5 MG X 11 & 1 MG X 42 PO TBPK
ORAL_TABLET | ORAL | 0 refills | Status: DC
Start: 2022-12-30 — End: 2023-05-27

## 2023-04-17 ENCOUNTER — Ambulatory Visit: Payer: 59 | Admitting: Primary Care

## 2023-05-27 ENCOUNTER — Other Ambulatory Visit: Payer: Self-pay

## 2023-05-27 ENCOUNTER — Other Ambulatory Visit: Payer: Self-pay | Admitting: Primary Care

## 2023-05-27 DIAGNOSIS — F40243 Fear of flying: Secondary | ICD-10-CM

## 2023-05-27 DIAGNOSIS — Z87898 Personal history of other specified conditions: Secondary | ICD-10-CM

## 2023-05-27 DIAGNOSIS — F5104 Psychophysiologic insomnia: Secondary | ICD-10-CM

## 2023-05-27 DIAGNOSIS — F32A Depression, unspecified: Secondary | ICD-10-CM

## 2023-05-27 MED ORDER — LORAZEPAM 0.5 MG PO TABS
ORAL_TABLET | ORAL | 0 refills | Status: DC
  Filled 2023-05-27: qty 10, 5d supply, fill #0

## 2023-05-27 MED ORDER — TRAZODONE HCL 50 MG PO TABS
ORAL_TABLET | ORAL | 0 refills | Status: DC
  Filled 2023-05-27: qty 90, 90d supply, fill #0

## 2023-05-27 MED ORDER — BUSPIRONE HCL 10 MG PO TABS
10.0000 mg | ORAL_TABLET | Freq: Two times a day (BID) | ORAL | 0 refills | Status: AC | PRN
Start: 2023-05-27 — End: ?
  Filled 2023-05-27: qty 180, 90d supply, fill #0

## 2023-05-27 MED ORDER — SCOPOLAMINE 1 MG/3DAYS TD PT72
1.0000 | MEDICATED_PATCH | TRANSDERMAL | 0 refills | Status: DC
  Filled 2023-05-27: qty 10, 30d supply, fill #0

## 2023-07-03 ENCOUNTER — Ambulatory Visit (INDEPENDENT_AMBULATORY_CARE_PROVIDER_SITE_OTHER): Payer: 59 | Admitting: Primary Care

## 2023-07-03 ENCOUNTER — Encounter: Payer: Self-pay | Admitting: Primary Care

## 2023-07-03 VITALS — BP 120/74 | HR 88 | Temp 98.6°F | Ht 63.5 in | Wt 150.0 lb

## 2023-07-03 DIAGNOSIS — Z1231 Encounter for screening mammogram for malignant neoplasm of breast: Secondary | ICD-10-CM | POA: Diagnosis not present

## 2023-07-03 DIAGNOSIS — Z803 Family history of malignant neoplasm of breast: Secondary | ICD-10-CM | POA: Diagnosis not present

## 2023-07-03 DIAGNOSIS — Z0000 Encounter for general adult medical examination without abnormal findings: Secondary | ICD-10-CM | POA: Diagnosis not present

## 2023-07-03 DIAGNOSIS — R1012 Left upper quadrant pain: Secondary | ICD-10-CM

## 2023-07-03 DIAGNOSIS — F32A Depression, unspecified: Secondary | ICD-10-CM | POA: Diagnosis not present

## 2023-07-03 DIAGNOSIS — E782 Mixed hyperlipidemia: Secondary | ICD-10-CM

## 2023-07-03 DIAGNOSIS — I10 Essential (primary) hypertension: Secondary | ICD-10-CM | POA: Diagnosis not present

## 2023-07-03 DIAGNOSIS — F419 Anxiety disorder, unspecified: Secondary | ICD-10-CM

## 2023-07-03 DIAGNOSIS — R7303 Prediabetes: Secondary | ICD-10-CM

## 2023-07-03 LAB — CBC
HCT: 43.3 % (ref 36.0–46.0)
Hemoglobin: 14.2 g/dL (ref 12.0–15.0)
MCHC: 32.7 g/dL (ref 30.0–36.0)
MCV: 93.7 fl (ref 78.0–100.0)
Platelets: 282 10*3/uL (ref 150.0–400.0)
RBC: 4.63 Mil/uL (ref 3.87–5.11)
RDW: 12.7 % (ref 11.5–15.5)
WBC: 9.1 10*3/uL (ref 4.0–10.5)

## 2023-07-03 LAB — LIPID PANEL
Cholesterol: 170 mg/dL (ref 0–200)
HDL: 37 mg/dL — ABNORMAL LOW (ref >39.00)
LDL Cholesterol: 116 mg/dL — ABNORMAL HIGH (ref 0–99)
NonHDL: 133.1
Total CHOL/HDL Ratio: 5
Triglycerides: 84 mg/dL (ref 0.0–149.0)
VLDL: 16.8 mg/dL (ref 0.0–40.0)

## 2023-07-03 LAB — LIPASE: Lipase: 60 U/L — ABNORMAL HIGH (ref 11.0–59.0)

## 2023-07-03 LAB — COMPREHENSIVE METABOLIC PANEL
ALT: 11 U/L (ref 0–35)
AST: 14 U/L (ref 0–37)
Albumin: 4.7 g/dL (ref 3.5–5.2)
Alkaline Phosphatase: 68 U/L (ref 39–117)
BUN: 15 mg/dL (ref 6–23)
CO2: 32 mEq/L (ref 19–32)
Calcium: 9.7 mg/dL (ref 8.4–10.5)
Chloride: 101 mEq/L (ref 96–112)
Creatinine, Ser: 0.63 mg/dL (ref 0.40–1.20)
GFR: 111.34 mL/min (ref >60.00)
Glucose, Bld: 90 mg/dL (ref 70–99)
Potassium: 4.1 mEq/L (ref 3.5–5.1)
Sodium: 138 mEq/L (ref 135–145)
Total Bilirubin: 0.8 mg/dL (ref 0.2–1.2)
Total Protein: 7.2 g/dL (ref 6.0–8.3)

## 2023-07-03 LAB — HEMOGLOBIN A1C: Hgb A1c MFr Bld: 5.1 % (ref 4.6–6.5)

## 2023-07-03 NOTE — Assessment & Plan Note (Signed)
Repeat lipid panel pending. No longer on simvastatin.

## 2023-07-03 NOTE — Assessment & Plan Note (Signed)
Immunizations UTD. Pap smear UTD.  Follows with GYN Mammogram due, orders placed.  Discussed the importance of a healthy diet and regular exercise in order for weight loss, and to reduce the risk of further co-morbidity.  Exam stable. Labs pending.  Follow up in 1 year for repeat physical.

## 2023-07-03 NOTE — Assessment & Plan Note (Signed)
Controlled.  Continue buspirone 5 mg twice daily as needed. Continue lorazepam 0.5 mg as needed for flights.

## 2023-07-03 NOTE — Progress Notes (Signed)
Subjective:    Patient ID: Felicia Mcdowell, female    DOB: [birth year], 42 y.o.   MRN: [id number]  HPI  Felicia Mcdowell is a very pleasant 42 y.o. female who presents today for complete physical and follow up of chronic conditions.  Immunizations: -Tetanus: Completed within 10 years   Diet: Fair diet.  Exercise: No regular exercise.  Eye exam: Completes as needed  Dental exam: Completes semi-annually    Pap Smear: September 2021 Mammogram: Due next month  BP Readings from Last 3 Encounters:  07/03/23 120/74  02/28/22 118/78  08/25/21 130/83      Review of Systems  Constitutional:  Negative for unexpected weight change.  HENT:  Negative for rhinorrhea.   Respiratory:  Negative for cough and shortness of breath.   Cardiovascular:  Negative for chest pain.  Gastrointestinal:  Negative for constipation and diarrhea.  Genitourinary:  Negative for difficulty urinating and menstrual problem.  Musculoskeletal:  Negative for arthralgias and myalgias.  Skin:  Negative for rash.  Allergic/Immunologic: Negative for environmental allergies.  Neurological:  Negative for dizziness, numbness and headaches.  Psychiatric/Behavioral:  The patient is not nervous/anxious.          Past Medical History:  Diagnosis Date   Abdominal pain, epigastric 04/30/2016   Aneurysm of right superficial temporal artery (HCC) 08/25/2021   Anxiety    Back pain    BRCA negative 07/2020   MyRisk neg   Constipation    Family history of breast cancer    GERD (gastroesophageal reflux disease)    Headache    High cholesterol    Increased risk of breast cancer 07/2020   IBIS=26.5%/riskscore-18.4%   Prediabetes    Right temporal lobe mass 08/22/2021    Social History   Socioeconomic History   Marital status: Married    Spouse name: Event organiser   Number of children: 2   Years of education: Not on file   Highest education level: Not on file  Occupational History   Occupation: Theme park manager: [employer]  Tobacco Use   Smoking status: Every Day    Current packs/day: 0.50    Types: Cigarettes   Smokeless tobacco: Never  Vaping Use   Vaping status: Never Used  Substance and Sexual Activity   Alcohol use: Yes    Comment: 1-2 glasses wine on weekend nights   Drug use: No   Sexual activity: Yes    Partners: Male    Birth control/protection: Surgical  Other Topics Concern   Not on file  Social History Narrative   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[employer]   Married, two daughters   Enjoys exercising, reading         Social Determinants of Health   Financial Resource Strain: Not on file  Food Insecurity: Not on file  Transportation Needs: Not on file  Physical Activity: Not on file  Stress: Not on file  Social Connections: Not on file  Intimate Partner Violence: Not on file    Past Surgical History:  Procedure Laterality Date   AUGMENTATION MAMMAPLASTY Bilateral 12/2019   BREAST SURGERY     HYSTEROSCOPY WITH NOVASURE N/A 01/05/2016   Procedure: HYSTEROSCOPY WITH NOVASURE;  Surgeon: Elenora Fender Ward, MD;  Location: [facility];  Service: Gynecology;  Laterality: N/A;   LAPAROSCOPIC TUBAL LIGATION Bilateral 01/05/2016   Procedure: bilateral laparoscopic salpingectomy, endometrial abalation;  Surgeon: Elenora Fender Ward, MD;  Location: [facility];  Service: Gynecology;  Laterality: Bilateral;  TONSILLECTOMY      Family History  Problem Relation Age of Onset   Cancer Mother 3       breast CA, now with metastatic cancer   Depression Mother    Anxiety disorder Mother    Breast cancer Mother 57   Heart disease Father    Hyperlipidemia Father    Hypertension Father    Sudden death Father    Hypertension Maternal Grandmother    Kidney disease Maternal Grandmother     Allergies  Allergen Reactions   Augmentin [Amoxicillin-Pot Clavulanate] Hives and Rash    Rash     Current Outpatient Medications on File Prior to Visit  Medication Sig Dispense Refill    busPIRone (BUSPAR) 10 MG tablet Take 1 tablet (10 mg total) by mouth 2 (two) times daily as needed. For anxiety. 180 tablet 0   clobetasol ointment (TEMOVATE) 0.05 % Apply to affected area when there are symptoms nightly as needed. 30 g 5   LORazepam (ATIVAN) 0.5 MG tablet Take 1 tablet by mouth 30-60 minutes prior to flights. 10 tablet 0   traZODone (DESYREL) 50 MG tablet TAKE 1/2 TO 1 TABLET (25-50 MG TOTAL) BY MOUTH AT BEDTIME AS NEEDED FOR SLEEP. 90 tablet 0   No current facility-administered medications on file prior to visit.    BP 120/74   Pulse 88   Temp 98.6 F (37 C) (Temporal)   Ht 5' 3.5" (1.613 m)   Wt 150 lb (68 kg)   SpO2 99%   BMI 26.15 kg/m  Objective:   Physical Exam HENT:     Right Ear: Tympanic membrane and ear canal normal.     Left Ear: Tympanic membrane and ear canal normal.     Nose: Nose normal.  Eyes:     Conjunctiva/sclera: Conjunctivae normal.     Pupils: Pupils are equal, round, and reactive to light.  Neck:     Thyroid: No thyromegaly.  Cardiovascular:     Rate and Rhythm: Normal rate and regular rhythm.     Heart sounds: No murmur heard. Pulmonary:     Effort: Pulmonary effort is normal.     Breath sounds: Normal breath sounds. No rales.  Abdominal:     General: Bowel sounds are normal.     Palpations: Abdomen is soft.     Tenderness: There is no abdominal tenderness.  Musculoskeletal:        General: Normal range of motion.     Cervical back: Neck supple.  Lymphadenopathy:     Cervical: No cervical adenopathy.  Skin:    General: Skin is warm and dry.     Findings: No rash.  Neurological:     Mental Status: She is alert and oriented to person, place, and time.     Cranial Nerves: No cranial nerve deficit.     Deep Tendon Reflexes: Reflexes are normal and symmetric.  Psychiatric:        Mood and Affect: Mood normal.           Assessment & Plan:  Preventative health care Assessment & Plan: Immunizations UTD. Pap smear UTD.   Follows with GYN Mammogram due, orders placed.  Discussed the importance of a healthy diet and regular exercise in order for weight loss, and to reduce the risk of further co-morbidity.  Exam stable. Labs pending.  Follow up in 1 year for repeat physical.    Mixed hyperlipidemia Assessment & Plan: Repeat lipid panel pending. No longer on simvastatin.  Orders: -  Lipid panel -     Lipase  Prediabetes -     Hemoglobin A1c -     Lipase  Essential hypertension Assessment & Plan: Controlled.  Remain off treatment. CMP pending.  Orders: -     Comprehensive metabolic panel -     CBC  Screening mammogram for breast cancer -     3D Screening Mammogram w/Implants, Left and Right; Future  Anxiety and depression Assessment & Plan: Controlled.  Continue buspirone 5 mg twice daily as needed. Continue lorazepam 0.5 mg as needed for flights.   Family history of breast cancer Assessment & Plan: Mammogram overdue. Orders placed.  She will call to schedule.         Doreene Nest, NP

## 2023-07-03 NOTE — Assessment & Plan Note (Signed)
Mammogram overdue. Orders placed.  She will call to schedule.

## 2023-07-03 NOTE — Assessment & Plan Note (Signed)
Controlled.  Remain off treatment. CMP pending.

## 2023-07-03 NOTE — Patient Instructions (Signed)
Stop by the lab prior to leaving today. I will notify you of your results once received.   [facility].   It was a pleasure to see you today!   

## 2023-07-04 ENCOUNTER — Other Ambulatory Visit: Payer: Self-pay | Admitting: Primary Care

## 2023-07-04 DIAGNOSIS — R748 Abnormal levels of other serum enzymes: Secondary | ICD-10-CM

## 2023-07-10 ENCOUNTER — Ambulatory Visit
Admission: RE | Admit: 2023-07-10 | Discharge: 2023-07-10 | Disposition: A | Discharge status: Home / Self Care | Payer: 59 | Source: Ambulatory Visit | Attending: Primary Care | Admitting: Primary Care

## 2023-07-10 ENCOUNTER — Telehealth: Payer: Self-pay | Admitting: Primary Care

## 2023-07-10 ENCOUNTER — Other Ambulatory Visit: Payer: Self-pay | Admitting: Primary Care

## 2023-07-10 DIAGNOSIS — R1012 Left upper quadrant pain: Secondary | ICD-10-CM | POA: Insufficient documentation

## 2023-07-10 DIAGNOSIS — R161 Splenomegaly, not elsewhere classified: Secondary | ICD-10-CM | POA: Diagnosis not present

## 2023-07-10 DIAGNOSIS — K760 Fatty (change of) liver, not elsewhere classified: Secondary | ICD-10-CM | POA: Diagnosis not present

## 2023-07-10 MED ORDER — IOHEXOL 300 MG/ML  SOLN
100.0000 mL | Freq: Once | INTRAMUSCULAR | Status: AC | PRN
  Administered 2023-07-10: 100 mL via INTRAVENOUS

## 2023-07-10 NOTE — Telephone Encounter (Signed)
Have made referral team aware.

## 2023-07-10 NOTE — Telephone Encounter (Signed)
Spoke with patient in follow-up regarding her abdominal pain.  Her pain is located to the left upper quadrant which has been consistent for the last few weeks.  Lab work during her recent physical showed a lipase of 60 with normal CBC.  H. pylori breath test ordered and pending. Will obtain CT abdomen/pelvis for further evaluation.  Can we alert the stat teams pool?

## 2023-07-12 DIAGNOSIS — R1012 Left upper quadrant pain: Secondary | ICD-10-CM | POA: Diagnosis not present

## 2023-07-17 ENCOUNTER — Ambulatory Visit
Admission: RE | Admit: 2023-07-17 | Discharge: 2023-07-17 | Disposition: A | Discharge status: Home / Self Care | Payer: 59 | Source: Ambulatory Visit | Attending: Primary Care | Admitting: Primary Care

## 2023-07-17 DIAGNOSIS — Z1231 Encounter for screening mammogram for malignant neoplasm of breast: Secondary | ICD-10-CM | POA: Insufficient documentation

## 2023-07-19 LAB — H. PYLORI BREATH TEST: H. pylori Breath Test: NOT DETECTED

## 2023-08-07 DIAGNOSIS — F331 Major depressive disorder, recurrent, moderate: Secondary | ICD-10-CM | POA: Diagnosis not present

## 2023-08-21 DIAGNOSIS — F331 Major depressive disorder, recurrent, moderate: Secondary | ICD-10-CM | POA: Diagnosis not present

## 2023-08-28 DIAGNOSIS — F331 Major depressive disorder, recurrent, moderate: Secondary | ICD-10-CM | POA: Diagnosis not present

## 2023-09-04 DIAGNOSIS — F331 Major depressive disorder, recurrent, moderate: Secondary | ICD-10-CM | POA: Diagnosis not present

## 2023-09-11 DIAGNOSIS — F331 Major depressive disorder, recurrent, moderate: Secondary | ICD-10-CM | POA: Diagnosis not present

## 2023-09-25 DIAGNOSIS — F331 Major depressive disorder, recurrent, moderate: Secondary | ICD-10-CM | POA: Diagnosis not present

## 2023-10-23 DIAGNOSIS — F331 Major depressive disorder, recurrent, moderate: Secondary | ICD-10-CM | POA: Diagnosis not present

## 2023-11-27 DIAGNOSIS — F331 Major depressive disorder, recurrent, moderate: Secondary | ICD-10-CM | POA: Diagnosis not present

## 2023-12-20 ENCOUNTER — Telehealth: Payer: Self-pay | Admitting: Primary Care

## 2023-12-20 ENCOUNTER — Other Ambulatory Visit: Payer: Self-pay

## 2023-12-20 DIAGNOSIS — B009 Herpesviral infection, unspecified: Secondary | ICD-10-CM

## 2023-12-20 MED ORDER — VALACYCLOVIR HCL 500 MG PO TABS
500.0000 mg | ORAL_TABLET | Freq: Every day | ORAL | 1 refills | Status: AC
Start: 2023-12-20 — End: ?
  Filled 2023-12-20: qty 90, 90d supply, fill #0

## 2023-12-20 NOTE — Telephone Encounter (Signed)
Received phone call from patient regarding recurrent herpes outbreaks over the last several months. Discussed with patient, she agrees to try daily preventative treatment for 3 months, then PRN. Rx for Valtrex 500 mg once daily x 3 months sent to pharmacy.  She will update.

## 2024-03-26 ENCOUNTER — Telehealth: Payer: Self-pay | Admitting: Primary Care

## 2024-03-26 ENCOUNTER — Other Ambulatory Visit: Payer: Self-pay | Admitting: Primary Care

## 2024-03-26 ENCOUNTER — Other Ambulatory Visit: Payer: Self-pay

## 2024-03-26 DIAGNOSIS — F40243 Fear of flying: Secondary | ICD-10-CM

## 2024-03-26 DIAGNOSIS — Z87898 Personal history of other specified conditions: Secondary | ICD-10-CM

## 2024-03-26 MED ORDER — SCOPOLAMINE 1 MG/3DAYS TD PT72
1.0000 | MEDICATED_PATCH | TRANSDERMAL | 0 refills | Status: AC
  Filled 2024-03-26: qty 10, 30d supply, fill #0

## 2024-03-26 MED ORDER — LORAZEPAM 0.5 MG PO TABS
0.5000 mg | ORAL_TABLET | ORAL | 0 refills | Status: AC
  Filled 2024-03-26: qty 15, 15d supply, fill #0

## 2024-03-26 NOTE — Telephone Encounter (Signed)
 Spoke with patient via phone.  She will be leaving for a trip to [place]  and is needing scopolamine  patches for a boat outing and a refill of her lorazepam  for flight anxiety.   Refills provided.

## 2024-05-27 ENCOUNTER — Other Ambulatory Visit: Payer: Self-pay

## 2024-05-27 DIAGNOSIS — R7303 Prediabetes: Secondary | ICD-10-CM

## 2024-05-27 DIAGNOSIS — E782 Mixed hyperlipidemia: Secondary | ICD-10-CM

## 2024-05-27 DIAGNOSIS — E663 Overweight: Secondary | ICD-10-CM

## 2024-05-27 DIAGNOSIS — I10 Essential (primary) hypertension: Secondary | ICD-10-CM

## 2024-05-27 MED ORDER — PHENTERMINE HCL 37.5 MG PO CAPS
37.5000 mg | ORAL_CAPSULE | ORAL | 0 refills | Status: AC
  Filled 2024-05-27: qty 90, 90d supply, fill #0

## 2024-07-08 ENCOUNTER — Ambulatory Visit: Payer: Self-pay | Admitting: Primary Care

## 2024-07-08 ENCOUNTER — Other Ambulatory Visit: Payer: Self-pay

## 2024-07-08 ENCOUNTER — Ambulatory Visit (INDEPENDENT_AMBULATORY_CARE_PROVIDER_SITE_OTHER): Admitting: Primary Care

## 2024-07-08 ENCOUNTER — Encounter: Payer: Self-pay | Admitting: Primary Care

## 2024-07-08 VITALS — BP 136/82 | HR 88 | Temp 97.7°F | Ht 63.5 in | Wt 155.0 lb

## 2024-07-08 DIAGNOSIS — Z803 Family history of malignant neoplasm of breast: Secondary | ICD-10-CM

## 2024-07-08 DIAGNOSIS — E782 Mixed hyperlipidemia: Secondary | ICD-10-CM | POA: Diagnosis not present

## 2024-07-08 DIAGNOSIS — F32A Depression, unspecified: Secondary | ICD-10-CM | POA: Diagnosis not present

## 2024-07-08 DIAGNOSIS — Z720 Tobacco use: Secondary | ICD-10-CM

## 2024-07-08 DIAGNOSIS — R7303 Prediabetes: Secondary | ICD-10-CM | POA: Diagnosis not present

## 2024-07-08 DIAGNOSIS — R748 Abnormal levels of other serum enzymes: Secondary | ICD-10-CM

## 2024-07-08 DIAGNOSIS — E663 Overweight: Secondary | ICD-10-CM

## 2024-07-08 DIAGNOSIS — Z0000 Encounter for general adult medical examination without abnormal findings: Secondary | ICD-10-CM

## 2024-07-08 DIAGNOSIS — Z1231 Encounter for screening mammogram for malignant neoplasm of breast: Secondary | ICD-10-CM

## 2024-07-08 DIAGNOSIS — I10 Essential (primary) hypertension: Secondary | ICD-10-CM | POA: Diagnosis not present

## 2024-07-08 DIAGNOSIS — F419 Anxiety disorder, unspecified: Secondary | ICD-10-CM | POA: Diagnosis not present

## 2024-07-08 LAB — CBC
HCT: 43.7 % (ref 36.0–46.0)
Hemoglobin: 14.5 g/dL (ref 12.0–15.0)
MCHC: 33.2 g/dL (ref 30.0–36.0)
MCV: 95.1 fl (ref 78.0–100.0)
Platelets: 255 K/uL (ref 150.0–400.0)
RBC: 4.6 Mil/uL (ref 3.87–5.11)
RDW: 13 % (ref 11.5–15.5)
WBC: 8.2 K/uL (ref 4.0–10.5)

## 2024-07-08 LAB — COMPREHENSIVE METABOLIC PANEL WITH GFR
ALT: 13 U/L (ref 0–35)
AST: 16 U/L (ref 0–37)
Albumin: 4.6 g/dL (ref 3.5–5.2)
Alkaline Phosphatase: 72 U/L (ref 39–117)
BUN: 12 mg/dL (ref 6–23)
CO2: 28 meq/L (ref 19–32)
Calcium: 8.7 mg/dL (ref 8.4–10.5)
Chloride: 102 meq/L (ref 96–112)
Creatinine, Ser: 0.68 mg/dL (ref 0.40–1.20)
GFR: 108.54 mL/min (ref >60.00)
Glucose, Bld: 88 mg/dL (ref 70–99)
Potassium: 3.9 meq/L (ref 3.5–5.1)
Sodium: 139 meq/L (ref 135–145)
Total Bilirubin: 0.7 mg/dL (ref 0.2–1.2)
Total Protein: 7.1 g/dL (ref 6.0–8.3)

## 2024-07-08 LAB — LIPID PANEL
Cholesterol: 173 mg/dL (ref 0–200)
HDL: 36.3 mg/dL — ABNORMAL LOW (ref >39.00)
LDL Cholesterol: 117 mg/dL — ABNORMAL HIGH (ref 0–99)
NonHDL: 136.83
Total CHOL/HDL Ratio: 5
Triglycerides: 101 mg/dL (ref 0.0–149.0)
VLDL: 20.2 mg/dL (ref 0.0–40.0)

## 2024-07-08 LAB — LIPASE: Lipase: 44 U/L (ref 11.0–59.0)

## 2024-07-08 LAB — HEMOGLOBIN A1C: Hgb A1c MFr Bld: 5.1 % (ref 4.6–6.5)

## 2024-07-08 MED ORDER — BUPROPION HCL ER (SR) 150 MG PO TB12
150.0000 mg | ORAL_TABLET | Freq: Two times a day (BID) | ORAL | 3 refills | Status: AC
  Filled 2024-07-08: qty 180, 90d supply, fill #0

## 2024-07-08 NOTE — Progress Notes (Signed)
 Subjective:    Patient ID: Felicia Mcdowell, female    DOB: [birth year], 43 y.o.   MRN: [id number]  Felicia Mcdowell is a very pleasant 43 y.o. female who presents today for complete physical and follow up of chronic conditions.  Immunizations: -Tetanus: Completed within 10 years  Diet: Fair diet.  Exercise: No regular exercise.  Eye exam: Completes annually  Dental exam: Completes semi-annually    Pap Smear: Completed in 2021 Mammogram: Completed in August 2024   BP Readings from Last 3 Encounters:  07/08/24 136/82  07/03/23 120/74  02/28/22 118/78   Wt Readings from Last 3 Encounters:  07/08/24 155 lb (70.3 kg)  07/03/23 150 lb (68 kg)  02/28/22 155 lb (70.3 kg)   Body mass index is 27.03 kg/m.    Review of Systems  Constitutional:  Negative for unexpected weight change.  HENT:  Negative for rhinorrhea.   Respiratory:  Positive for cough and shortness of breath.   Cardiovascular:  Negative for chest pain.  Gastrointestinal:  Negative for constipation and diarrhea.  Genitourinary:  Negative for difficulty urinating and menstrual problem.  Musculoskeletal:  Negative for arthralgias and myalgias.  Skin:  Negative for rash.  Allergic/Immunologic: Negative for environmental allergies.  Neurological:  Negative for dizziness and headaches.         Past Medical History:  Diagnosis Date   Abdominal pain, epigastric 04/30/2016   Aneurysm of right superficial temporal artery (HCC) 08/25/2021   Anxiety    Back pain    BRCA negative 07/2020   MyRisk neg   Constipation    Family history of breast cancer    GERD (gastroesophageal reflux disease)    Headache    High cholesterol    Increased risk of breast cancer 07/2020   IBIS=26.5%/riskscore-18.4%   Prediabetes    Right temporal lobe mass 08/22/2021    Social History   Socioeconomic History   Marital status: Married    Spouse name: Event organiser   Number of children: 2   Years of education: Not on file    Highest education level: Not on file  Occupational History   Occupation: Passenger transport manager: [employer]  Tobacco Use   Smoking status: Every Day    Current packs/day: 0.50    Types: Cigarettes   Smokeless tobacco: Never  Vaping Use   Vaping status: Never Used  Substance and Sexual Activity   Alcohol use: Yes    Comment: 1-2 glasses wine on weekend nights   Drug use: No   Sexual activity: Yes    Partners: Male    Birth control/protection: Surgical  Other Topics Concern   Not on file  Social History Narrative   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, two daughters   Enjoys exercising, reading         Social Drivers of Health   Financial Resource Strain: Not on file  Food Insecurity: Not on file  Transportation Needs: Not on file  Physical Activity: Not on file  Stress: Not on file  Social Connections: Not on file  Intimate Partner Violence: Not on file    Past Surgical History:  Procedure Laterality Date   AUGMENTATION MAMMAPLASTY Bilateral 12/2019   BREAST SURGERY     HYSTEROSCOPY WITH NOVASURE N/A 01/05/2016   Procedure: HYSTEROSCOPY WITH NOVASURE;  Surgeon: Mitzie BROCKS Ward, MD;  Location: [facility];  Service: Gynecology;  Laterality: N/A;   LAPAROSCOPIC TUBAL LIGATION Bilateral 01/05/2016   Procedure: bilateral  laparoscopic salpingectomy, endometrial abalation;  Surgeon: Mitzie BROCKS Ward, MD;  Location: [facility];  Service: Gynecology;  Laterality: Bilateral;   TONSILLECTOMY      Family History  Problem Relation Age of Onset   Cancer Mother 30       breast CA, now with metastatic cancer   Depression Mother    Anxiety disorder Mother    Breast cancer Mother 40   Heart disease Father    Hyperlipidemia Father    Hypertension Father    Sudden death Father    Hypertension Maternal Grandmother    Kidney disease Maternal Grandmother     Allergies  Allergen Reactions   Augmentin [Amoxicillin-Pot Clavulanate] Hives and Rash    Rash      Current Outpatient Medications on File Prior to Visit  Medication Sig Dispense Refill   busPIRone  (BUSPAR ) 10 MG tablet Take 1 tablet (10 mg total) by mouth 2 (two) times daily as needed. For anxiety. 180 tablet 0   clobetasol  ointment (TEMOVATE ) 0.05 % Apply to affected area when there are symptoms nightly as needed. 30 g 5   LORazepam  (ATIVAN ) 0.5 MG tablet Take 1 tablet (0.5 mg total) by mouth 30-60 minutes prior to flights. 15 tablet 0   phentermine  37.5 MG capsule Take 1 capsule (37.5 mg total) by mouth every morning. 90 capsule 0   scopolamine  (TRANSDERM-SCOP) 1 MG/3DAYS Place 1 patch (1.5 mg total) onto the skin every 3 (three) days. 10 patch 0   traZODone  (DESYREL ) 50 MG tablet TAKE 1/2 TO 1 TABLET (25-50 MG TOTAL) BY MOUTH AT BEDTIME AS NEEDED FOR SLEEP. 90 tablet 0   valACYclovir  (VALTREX ) 500 MG tablet Take 1 tablet (500 mg total) by mouth daily. 90 tablet 1   No current facility-administered medications on file prior to visit.    BP 136/82   Pulse 88   Temp 97.7 F (36.5 C) (Temporal)   Ht 5' 3.5 (1.613 m)   Wt 155 lb (70.3 kg)   SpO2 100%   BMI 27.03 kg/m  Objective:   Physical Exam HENT:     Right Ear: Tympanic membrane and ear canal normal.     Left Ear: Tympanic membrane and ear canal normal.  Eyes:     Pupils: Pupils are equal, round, and reactive to light.  Cardiovascular:     Rate and Rhythm: Normal rate and regular rhythm.  Pulmonary:     Effort: Pulmonary effort is normal.     Breath sounds: Examination of the right-lower field reveals wheezing. Wheezing present.  Abdominal:     General: Bowel sounds are normal.     Palpations: Abdomen is soft.     Tenderness: There is no abdominal tenderness.  Musculoskeletal:        General: Normal range of motion.     Cervical back: Neck supple.  Skin:    General: Skin is warm and dry.  Neurological:     Mental Status: She is alert and oriented to person, place, and time.     Cranial Nerves: No cranial nerve  deficit.     Deep Tendon Reflexes:     Reflex Scores:      Patellar reflexes are 2+ on the right side and 2+ on the left side. Psychiatric:        Mood and Affect: Mood normal.     Physical Exam        Assessment & Plan:  Preventative health care Assessment & Plan: Immunizations UTD. Tetanus is UTD per  patient. Pap smear UTD. Mammogram due, orders placed.  Discussed the importance of a healthy diet and regular exercise in order for weight loss, and to reduce the risk of further co-morbidity.  Exam stable. Labs pending.  Follow up in 1 year for repeat physical.    Prediabetes -     Hemoglobin A1c  Mixed hyperlipidemia Assessment & Plan: Repeat lipid panel pending. Remain off simvastatin .  Orders: -     Lipid panel  Essential hypertension Assessment & Plan: Controlled.  Continue off treatment.  Orders: -     CBC -     Comprehensive metabolic panel with GFR  Elevated lipase -     Lipase  Tobacco abuse Assessment & Plan: Resume Wellbutrin  SR 150 mg twice daily. She will update.  Orders: -     buPROPion  HCl ER (SR); Take 1 tablet (150 mg total) by mouth 2 (two) times daily.  Dispense: 180 tablet; Refill: 3  Screening mammogram for breast cancer -     3D Screening Mammogram w/Implants, Left and Right; Future  Overweight (BMI 25.0-29.9) Assessment & Plan: Commended her on weight loss, encouraged to continue!  Continue phentermine  37.5 mg daily.   Family history of breast cancer Assessment & Plan: Mammogram ordered and pending.   Anxiety and depression Assessment & Plan: Deteriorated.  Resume Wellbutrin  SR twice daily. Continue BuSpar  10 mg twice daily as needed. Continue trazodone  50 mg at bedtime as needed.     Assessment and Plan Assessment & Plan         Comer MARLA Gaskins, NP    Discussed the use of AI scribe software for clinical note transcription with the patient, who gave verbal consent to proceed.  History of  Present Illness

## 2024-07-08 NOTE — Assessment & Plan Note (Signed)
Mammogram ordered and pending

## 2024-07-08 NOTE — Assessment & Plan Note (Signed)
 Deteriorated.  Resume Wellbutrin  SR twice daily. Continue BuSpar  10 mg twice daily as needed. Continue trazodone  50 mg at bedtime as needed.

## 2024-07-08 NOTE — Assessment & Plan Note (Signed)
 Controlled. Continue off treatment.

## 2024-07-08 NOTE — Assessment & Plan Note (Signed)
 Resume Wellbutrin  SR 150 mg twice daily. She will update.

## 2024-07-08 NOTE — Assessment & Plan Note (Signed)
 Commended her on weight loss, encouraged to continue!  Continue phentermine  37.5 mg daily.

## 2024-07-08 NOTE — Assessment & Plan Note (Signed)
 Immunizations UTD. Tetanus is UTD per patient. Pap smear UTD. Mammogram due, orders placed.  Discussed the importance of a healthy diet and regular exercise in order for weight loss, and to reduce the risk of further co-morbidity.  Exam stable. Labs pending.  Follow up in 1 year for repeat physical.

## 2024-07-08 NOTE — Assessment & Plan Note (Signed)
 Repeat lipid panel pending. Remain off simvastatin .

## 2024-07-22 ENCOUNTER — Telehealth: Payer: Self-pay | Admitting: Primary Care

## 2024-07-22 ENCOUNTER — Ambulatory Visit
Admission: RE | Admit: 2024-07-22 | Discharge: 2024-07-22 | Disposition: A | Discharge status: Home / Self Care | Source: Ambulatory Visit | Attending: Primary Care | Admitting: Primary Care

## 2024-07-22 ENCOUNTER — Encounter: Payer: Self-pay | Admitting: *Deleted

## 2024-07-22 DIAGNOSIS — Z8249 Family history of ischemic heart disease and other diseases of the circulatory system: Secondary | ICD-10-CM

## 2024-07-22 DIAGNOSIS — E782 Mixed hyperlipidemia: Secondary | ICD-10-CM

## 2024-07-22 DIAGNOSIS — Z1231 Encounter for screening mammogram for malignant neoplasm of breast: Secondary | ICD-10-CM | POA: Insufficient documentation

## 2024-07-22 NOTE — Telephone Encounter (Signed)
 Received phone call from patient regarding lab results.  We discussed history of hyperlipidemia and family history of heart attack with sudden death in her father.  We will update her coronary CT scan as her last 1 was in 2020.  She agrees.  Orders placed.

## 2024-07-29 ENCOUNTER — Ambulatory Visit: Payer: Self-pay | Admitting: Primary Care

## 2024-07-29 ENCOUNTER — Ambulatory Visit
Admission: RE | Admit: 2024-07-29 | Discharge: 2024-07-29 | Disposition: A | Discharge status: Home / Self Care | Payer: Self-pay | Source: Ambulatory Visit | Attending: Primary Care | Admitting: Primary Care

## 2024-07-29 ENCOUNTER — Telehealth: Admitting: Physician Assistant

## 2024-07-29 DIAGNOSIS — J019 Acute sinusitis, unspecified: Secondary | ICD-10-CM | POA: Diagnosis not present

## 2024-07-29 DIAGNOSIS — E782 Mixed hyperlipidemia: Secondary | ICD-10-CM | POA: Insufficient documentation

## 2024-07-29 DIAGNOSIS — B9689 Other specified bacterial agents as the cause of diseases classified elsewhere: Secondary | ICD-10-CM

## 2024-07-29 DIAGNOSIS — Z8249 Family history of ischemic heart disease and other diseases of the circulatory system: Secondary | ICD-10-CM | POA: Insufficient documentation

## 2024-07-29 MED ORDER — DOXYCYCLINE HYCLATE 100 MG PO TABS: 100.0000 mg | ORAL_TABLET | Freq: Two times a day (BID) | ORAL | 0 refills | Status: AC

## 2024-07-29 MED ORDER — PREDNISONE 20 MG PO TABS: ORAL_TABLET | ORAL | 0 refills | Status: AC

## 2024-07-29 NOTE — Progress Notes (Signed)
 I have spent 5 minutes in review of e-visit questionnaire, review and updating patient chart, medical decision making and response to patient.   Felicia Velma Lunger, PA-C

## 2024-07-29 NOTE — Progress Notes (Signed)

## 2024-08-10 ENCOUNTER — Telehealth: Payer: Self-pay

## 2024-08-10 NOTE — Telephone Encounter (Signed)
 Created in error due to CRM being attached to a providers chart.

## 2024-08-10 NOTE — Telephone Encounter (Signed)
 Copied from CRM [id number]. Topic: General - Other >> Aug 10, 2024 12:19 PM Anairis L wrote: Reason for CRM: Steffan with [employer] calling in because they have a package for patient Brielynn Sekula from [employer] that was unable to be delivered. [id number] [phone]  Please call back.

## 2024-11-16 ENCOUNTER — Other Ambulatory Visit: Payer: Self-pay | Admitting: Primary Care

## 2024-11-16 ENCOUNTER — Other Ambulatory Visit: Payer: Self-pay

## 2024-11-16 DIAGNOSIS — F5104 Psychophysiologic insomnia: Secondary | ICD-10-CM

## 2024-11-16 MED ORDER — TRAZODONE HCL 50 MG PO TABS
ORAL_TABLET | ORAL | 1 refills | Status: AC
  Filled 2024-11-16: qty 90, 90d supply, fill #0
